# Patient Record
Sex: Female | Born: 1945 | Race: White | Hispanic: No | Marital: Married | Smoking: Former smoker
Health system: Southern US, Community
[De-identification: ages and names within clinical notes are randomized; demographics above are authoritative.]

## PROBLEM LIST (undated history)

## (undated) DIAGNOSIS — I609 Nontraumatic subarachnoid hemorrhage, unspecified: Secondary | ICD-10-CM

## (undated) DIAGNOSIS — S0990XA Unspecified injury of head, initial encounter: Secondary | ICD-10-CM

## (undated) HISTORY — DX: Unspecified injury of head, initial encounter: S09.90XA

## (undated) HISTORY — PX: OTHER SURGICAL HISTORY: SHX169

## (undated) HISTORY — PX: PELVIC LAPAROSCOPY: SHX162

## (undated) HISTORY — PX: NASAL SINUS SURGERY: SHX719

## (undated) HISTORY — DX: Nontraumatic subarachnoid hemorrhage, unspecified: I60.9

## (undated) HISTORY — PX: TUBAL LIGATION: SHX77

---

## 1980-08-15 HISTORY — PX: AUGMENTATION MAMMAPLASTY: SUR837

## 1998-10-15 ENCOUNTER — Encounter: Payer: Self-pay | Admitting: Pulmonary Disease

## 1999-05-24 ENCOUNTER — Encounter: Admission: RE | Admit: 1999-05-24 | Discharge: 1999-05-24 | Payer: Self-pay | Admitting: Obstetrics and Gynecology

## 1999-08-25 ENCOUNTER — Encounter: Payer: Self-pay | Admitting: Otolaryngology

## 1999-08-25 ENCOUNTER — Encounter: Admission: RE | Admit: 1999-08-25 | Discharge: 1999-08-25 | Payer: Self-pay | Admitting: Otolaryngology

## 1999-09-08 ENCOUNTER — Other Ambulatory Visit: Admission: RE | Admit: 1999-09-08 | Discharge: 1999-09-08 | Payer: Self-pay | Admitting: Obstetrics and Gynecology

## 1999-09-21 ENCOUNTER — Encounter (INDEPENDENT_AMBULATORY_CARE_PROVIDER_SITE_OTHER): Payer: Self-pay

## 1999-09-21 ENCOUNTER — Other Ambulatory Visit: Admission: RE | Admit: 1999-09-21 | Discharge: 1999-09-21 | Payer: Self-pay | Admitting: Otolaryngology

## 2002-09-25 ENCOUNTER — Encounter: Admission: RE | Admit: 2002-09-25 | Discharge: 2002-09-25 | Payer: Self-pay | Admitting: Obstetrics and Gynecology

## 2002-09-25 ENCOUNTER — Encounter: Payer: Self-pay | Admitting: Obstetrics and Gynecology

## 2002-10-08 ENCOUNTER — Other Ambulatory Visit: Admission: RE | Admit: 2002-10-08 | Discharge: 2002-10-08 | Payer: Self-pay | Admitting: Obstetrics and Gynecology

## 2004-02-04 ENCOUNTER — Encounter: Admission: RE | Admit: 2004-02-04 | Discharge: 2004-02-04 | Payer: Self-pay | Admitting: Obstetrics and Gynecology

## 2004-03-17 ENCOUNTER — Other Ambulatory Visit: Admission: RE | Admit: 2004-03-17 | Discharge: 2004-03-17 | Payer: Self-pay | Admitting: Obstetrics and Gynecology

## 2004-12-02 ENCOUNTER — Encounter: Admission: RE | Admit: 2004-12-02 | Discharge: 2004-12-02 | Payer: Self-pay | Admitting: Neurological Surgery

## 2004-12-28 ENCOUNTER — Ambulatory Visit: Payer: Self-pay | Admitting: Pulmonary Disease

## 2005-01-25 ENCOUNTER — Ambulatory Visit: Payer: Self-pay | Admitting: Pulmonary Disease

## 2005-04-07 ENCOUNTER — Encounter: Admission: RE | Admit: 2005-04-07 | Discharge: 2005-04-07 | Payer: Self-pay | Admitting: Obstetrics and Gynecology

## 2005-04-26 ENCOUNTER — Other Ambulatory Visit: Admission: RE | Admit: 2005-04-26 | Discharge: 2005-04-26 | Payer: Self-pay | Admitting: Obstetrics and Gynecology

## 2006-06-13 ENCOUNTER — Encounter: Admission: RE | Admit: 2006-06-13 | Discharge: 2006-06-13 | Payer: Self-pay | Admitting: Obstetrics and Gynecology

## 2006-06-21 ENCOUNTER — Other Ambulatory Visit: Admission: RE | Admit: 2006-06-21 | Discharge: 2006-06-21 | Payer: Self-pay | Admitting: Obstetrics and Gynecology

## 2006-09-22 ENCOUNTER — Encounter: Admission: RE | Admit: 2006-09-22 | Discharge: 2006-09-22 | Payer: Self-pay | Admitting: Radiology

## 2006-09-22 ENCOUNTER — Inpatient Hospital Stay (HOSPITAL_COMMUNITY): Admission: EM | Admit: 2006-09-22 | Discharge: 2006-09-26 | Payer: Self-pay | Admitting: Emergency Medicine

## 2006-10-10 ENCOUNTER — Encounter: Admission: RE | Admit: 2006-10-10 | Discharge: 2006-10-10 | Payer: Self-pay | Admitting: Radiology

## 2006-12-29 ENCOUNTER — Encounter: Admission: RE | Admit: 2006-12-29 | Discharge: 2006-12-29 | Payer: Self-pay | Admitting: Neurosurgery

## 2007-06-20 ENCOUNTER — Encounter: Admission: RE | Admit: 2007-06-20 | Discharge: 2007-06-20 | Payer: Self-pay | Admitting: Obstetrics and Gynecology

## 2007-07-19 ENCOUNTER — Other Ambulatory Visit: Admission: RE | Admit: 2007-07-19 | Discharge: 2007-07-19 | Payer: Self-pay | Admitting: Obstetrics and Gynecology

## 2010-03-30 ENCOUNTER — Encounter: Admission: RE | Admit: 2010-03-30 | Discharge: 2010-03-30 | Payer: Self-pay | Admitting: Obstetrics and Gynecology

## 2010-04-13 ENCOUNTER — Other Ambulatory Visit: Admission: RE | Admit: 2010-04-13 | Discharge: 2010-04-13 | Payer: Self-pay | Admitting: Obstetrics and Gynecology

## 2010-04-13 ENCOUNTER — Ambulatory Visit: Payer: Self-pay | Admitting: Obstetrics and Gynecology

## 2010-07-12 ENCOUNTER — Telehealth (INDEPENDENT_AMBULATORY_CARE_PROVIDER_SITE_OTHER): Payer: Self-pay | Admitting: *Deleted

## 2010-07-15 ENCOUNTER — Ambulatory Visit: Payer: Self-pay | Admitting: Pulmonary Disease

## 2010-07-15 DIAGNOSIS — J45901 Unspecified asthma with (acute) exacerbation: Secondary | ICD-10-CM | POA: Insufficient documentation

## 2010-07-15 DIAGNOSIS — J209 Acute bronchitis, unspecified: Secondary | ICD-10-CM | POA: Insufficient documentation

## 2010-07-15 DIAGNOSIS — J4520 Mild intermittent asthma, uncomplicated: Secondary | ICD-10-CM | POA: Insufficient documentation

## 2010-08-24 ENCOUNTER — Ambulatory Visit
Admission: RE | Admit: 2010-08-24 | Discharge: 2010-08-24 | Payer: Self-pay | Source: Home / Self Care | Attending: Pulmonary Disease | Admitting: Pulmonary Disease

## 2010-09-13 ENCOUNTER — Telehealth: Payer: Self-pay | Admitting: Pulmonary Disease

## 2010-09-14 ENCOUNTER — Ambulatory Visit
Admission: RE | Admit: 2010-09-14 | Discharge: 2010-09-14 | Payer: Self-pay | Source: Home / Self Care | Attending: Pulmonary Disease | Admitting: Pulmonary Disease

## 2010-09-14 DIAGNOSIS — J019 Acute sinusitis, unspecified: Secondary | ICD-10-CM | POA: Insufficient documentation

## 2010-09-14 NOTE — Assessment & Plan Note (Signed)
Summary: acute sick visit for bronchitis and asthma exacerbation   Copy to:  Self  Primary Provider/Referring Provider:  n/a  CC:  former pt was last seen 2006. pt c/o dry cough w/ occas phlem, increased sob, wheezing, and chest congestion and tightness x3 months ago. Danielle Riddle  History of Present Illness: The pt comes in today for evaluation of asthma.  She was diagnosed with airflow obstruction in 2006 that was felt to be due to asthma, and she did well on treatment with asmanex.  She has since been lost to f/u, and has not maintained on her meds.  She feels she did very well until 3 mos ago when she developed an episode of chest tightness with worsening sob.  Has had 2 more severe episodes since then, and now has chest congestion with cough productive of yellow mucus.  She has also had nasal congestion, and postnasal drip.  She denies any fevers or chills.  Preventive Screening-Counseling & Management  Alcohol-Tobacco     Smoking Status: quit  Current Medications (verified): 1)  None  Allergies (verified): No Known Drug Allergies  Past History:  Past Medical History: Asthma h/o nasal polyps  Past Surgical History: tubal ligation sinus surgery  Family History: Reviewed history and no changes required. asthma--mother allergies: mother  Social History: Reviewed history and no changes required. married Patient states former smoker. just smoked in high school. 1-2 cigs a week. occupation: n/a children--2 Smoking Status:  quit  Review of Systems       The patient complains of shortness of breath with activity, shortness of breath at rest, and non-productive cough.  The patient denies productive cough, coughing up blood, chest pain, irregular heartbeats, acid heartburn, indigestion, loss of appetite, weight change, abdominal pain, difficulty swallowing, sore throat, tooth/dental problems, headaches, nasal congestion/difficulty breathing through nose, sneezing, itching, ear ache,  anxiety, depression, hand/feet swelling, joint stiffness or pain, rash, change in color of mucus, and fever.    Vital Signs:  Patient profile:   65 year old female Height:      65 inches Weight:      144.25 pounds BMI:     24.09 O2 Sat:      95 % on Room air Temp:     97.8 degrees F oral Pulse rate:   83 / minute BP sitting:   118 / 62  (left arm) Cuff size:   regular  Vitals Entered By: Carver Fila (July 15, 2010 1:48 PM)  O2 Flow:  Room air CC: former pt was last seen 2006. pt c/o dry cough w/ occas phlem, increased sob, wheezing, chest congestion and tightness x3 months ago.  Comments meds and allergies updated Phone number updated Carver Fila  July 15, 2010 1:49 PM    Physical Exam  General:  wd female in nad Eyes:  PERRLA and EOMI.   Nose:  significant edema and inflammation, no purulence noted. Mouth:  clear, no exudates seen. Neck:  no jvd, tmg, LN Lungs:  diffuse wheezing, rhonchi thoughout no crackles. Heart:  rrr, no mrg Abdomen:  soft and nontender, bs+ Extremities:  no edema or cyanosis, pulses intact distally Neurologic:  alert and oriented, moves all 4.   Impression & Recommendations:  Problem # 1:  INTRINSIC ASTHMA, WITH EXACERBATION (ICD-493.12) the pt has a h/o asthma which has been well controlled in the past with asmanex.  I have not seen her since 2006, and she stopped taking her meds.  She has done well until  she developed what I suspect is sinobronchitis, and now has acute bronchospasm on exam.  She will need treatment with abx and a prednisone taper.  Will also restart on maintenance medications.  I have discussed with her the concept of airway remodeling, and the importance of staying on maintenance meds for airway inflammation.  Problem # 2:  BRONCHITIS, ACUTE (ICD-466.0) the pt's h/o is most suggestive of sinobronchitis.  Will treat with an abx.  Medications Added to Medication List This Visit: 1)  Prednisone 10 Mg Tabs (Prednisone)  .... Take 4 each day for 2 days, then 3 each day for 2 days, then 2 each day for 2 days, then 1 each day for 2 days, then stop 2)  Cefdinir 300 Mg Caps (Cefdinir) .... 2 each day for 7days 3)  Asmanex 60 Metered Doses 220 Mcg/inh Aepb (Mometasone furoate) .... 2 at bedtime each night 4)  Ventolin Hfa 108 (90 Base) Mcg/act Aers (Albuterol sulfate) .... 2 puffs every 4-6 hours as needed  Other Orders: Est. Patient Level IV (42595)  Patient Instructions: 1)  will treat with prednisone taper...start tonight 2)  take omnicef 300mg  2 each day for 7 days 3)  restart asmanex 2 inhalation at bedtime for next 4 weeks, then can decrease to one at bedtime.  rinse mouth well. 4)  ventolin rescue inhaler...2 puffs up to every 4-6 hrs if needed for emergencies.  Can use more regularly until you start to get better, then use only as needed. 5)  followup with me in 6wks, but call if not doing well.   Prescriptions: VENTOLIN HFA 108 (90 BASE) MCG/ACT  AERS (ALBUTEROL SULFATE) 2 puffs every 4-6 hours as needed  #1 x 6   Entered and Authorized by:   Barbaraann Share MD   Signed by:   Barbaraann Share MD on 07/15/2010   Method used:   Print then Give to Patient   RxID:   [id number] ASMANEX 60 METERED DOSES 220 MCG/INH AEPB (MOMETASONE FUROATE) 2 at bedtime each night  #1 x 11   Entered and Authorized by:   Barbaraann Share MD   Signed by:   Barbaraann Share MD on 07/15/2010   Method used:   Print then Give to Patient   RxID:   [id number] CEFDINIR 300 MG CAPS (CEFDINIR) 2 each day for 7days  #14 x 0   Entered and Authorized by:   Barbaraann Share MD   Signed by:   Barbaraann Share MD on 07/15/2010   Method used:   Print then Give to Patient   RxID:   [id number] PREDNISONE 10 MG  TABS (PREDNISONE) take 4 each day for 2 days, then 3 each day for 2 days, then 2 each day for 2 days, then 1 each day for 2 days, then stop  #20 x 0   Entered and Authorized by:   Barbaraann Share MD   Signed by:   Barbaraann Share MD on 07/15/2010   Method used:   Print then Give to Patient   RxID:   [id number]

## 2010-09-14 NOTE — Progress Notes (Signed)
Summary: [facility]   Imported By: Sherian Rein 07/16/2010 11:30:36  _____________________________________________________________________  External Attachment:    Type:   Image     Comment:   External Document

## 2010-09-14 NOTE — Progress Notes (Signed)
Summary: wants appt this week/ pt scheduled 11/30  Phone Note Call from Patient Call back at Home Phone [phone]   Caller: Patient Call For: clance Summary of Call: pt wants to see you this week last seen in 2006  Initial call taken by: Lacinda Axon,  July 12, 2010 10:05 AM  Follow-up for Phone Call        Called, spoke with pt.  She states she is having increased SOB, "weight on chest," and "squeeking" when breathing x 1-2 wks.  States she has no PCP, requesting to see KC this week as she really does not want to go to urgent care.  She is aware she is considered a new pt bc if has been over 3 yrs since she was seen - 1st available consult slot dec 12.  pls advise if able to work pt in this week or not.  Thanks! Follow-up by: Gweneth Dimitri RN,  July 12, 2010 12:26 PM  Additional Follow-up for Phone Call Additional follow up Details #1::        mindy, lets look at my schedule tomorrow to see if there is a place to put her. Additional Follow-up by: Barbaraann Share MD,  July 12, 2010 5:59 PM    Additional Follow-up for Phone Call Additional follow up Details #2::    called and spoke with pt and we had an openeing on 11/30 at 2:00 and pt will be coming in. pt is aware to arrive 15 minutes early due to we have not seen her since 2006 and has to fill out the consult form. pt verbalized understanding Carver Fila  July 13, 2010 8:44 AM    Appended Document: wants appt this week/ pt scheduled 11/30 called to inform pt that the apt is not on 11/30 instead it's 12/1 at 2: 00. lmom to inform pt of that.

## 2010-09-16 NOTE — Assessment & Plan Note (Signed)
Summary: rov for asthma   Copy to:  Self  Primary Provider/Referring Provider:  n/a  CC:  6 week f/u appt.  breathing is "much better."   finishe abx and pred taper.  hasn't needed to use rescue hfa. .  History of Present Illness: the pt comes in today for f/u of her known asthma.  At the last visit, she was having an acute exacerbation, and was treated with a course of prednisone and started back on maintenance therapy.  She comes in today where she is much improved, and has not required her rescue inhaler since the last visit.  She is maintaining on her asmanex.  She denies any cough or congestion currently  Current Medications (verified): 1)  Asmanex 60 Metered Doses 220 Mcg/inh Aepb (Mometasone Furoate) .... 2 At Bedtime Each Night 2)  Ventolin Hfa 108 (90 Base) Mcg/act  Aers (Albuterol Sulfate) .... 2 Puffs Every 4-6 Hours As Needed  Allergies (verified): No Known Drug Allergies  Review of Systems       The patient complains of shortness of breath with activity and nasal congestion/difficulty breathing through nose.  The patient denies shortness of breath at rest, productive cough, non-productive cough, coughing up blood, chest pain, irregular heartbeats, acid heartburn, indigestion, loss of appetite, weight change, abdominal pain, difficulty swallowing, sore throat, tooth/dental problems, headaches, sneezing, itching, ear ache, anxiety, depression, hand/feet swelling, joint stiffness or pain, rash, change in color of mucus, and fever.    Vital Signs:  Patient profile:   65 year old female Height:      65 inches Weight:      150.50 pounds BMI:     25.14 O2 Sat:      96 % on Room air Temp:     97.7 degrees F oral Pulse rate:   66 / minute BP sitting:   104 / 70  (left arm) Cuff size:   regular  Vitals Entered By: Arman Filter LPN (August 24, 2010 11:57 AM)  O2 Flow:  Room air CC: 6 week f/u appt.  breathing is "much better."   finishe abx and pred taper.  hasn't needed to  use rescue hfa.  Comments Medications reviewed with patient Arman Filter LPN  August 24, 2010 11:58 AM    Physical Exam  General:  wd female in nad Lungs:  totally clear to auscultation Heart:  rrr, no mrg   Impression & Recommendations:  Problem # 1:  ASTHMA (ICD-493.90) the pt is much improved after treatment with a course of steroids and getting back on maintenance ICS.  I have asked her to maintain on asmanex at bedtime, but can decrease to one inhalation at bedtime in next few weeks.  She will f/u in one year.  Other Orders: No Charge Patient Arrived (NCPA0) (NCPA0)  Patient Instructions: 1)  continue on asmanex 2 inhalations at bedtime for another few weeks, then decrease to one . 2)  followup with me in 1 year, or sooner if having issues.   Immunization History:  Influenza Immunization History:    Influenza:  historical (09/15/2009)

## 2010-09-19 ENCOUNTER — Encounter (HOSPITAL_COMMUNITY): Payer: Self-pay

## 2010-09-19 ENCOUNTER — Emergency Department (HOSPITAL_COMMUNITY): Payer: PRIVATE HEALTH INSURANCE

## 2010-09-19 ENCOUNTER — Emergency Department (HOSPITAL_COMMUNITY)
Admission: EM | Admit: 2010-09-19 | Discharge: 2010-09-19 | Disposition: A | Discharge status: Home / Self Care | Payer: PRIVATE HEALTH INSURANCE | Attending: Emergency Medicine | Admitting: Emergency Medicine

## 2010-09-19 DIAGNOSIS — G43909 Migraine, unspecified, not intractable, without status migrainosus: Secondary | ICD-10-CM | POA: Insufficient documentation

## 2010-09-19 DIAGNOSIS — M542 Cervicalgia: Secondary | ICD-10-CM | POA: Insufficient documentation

## 2010-09-19 DIAGNOSIS — H53149 Visual discomfort, unspecified: Secondary | ICD-10-CM | POA: Insufficient documentation

## 2010-09-19 DIAGNOSIS — H5710 Ocular pain, unspecified eye: Secondary | ICD-10-CM | POA: Insufficient documentation

## 2010-09-19 DIAGNOSIS — R112 Nausea with vomiting, unspecified: Secondary | ICD-10-CM | POA: Insufficient documentation

## 2010-09-19 DIAGNOSIS — J329 Chronic sinusitis, unspecified: Secondary | ICD-10-CM | POA: Insufficient documentation

## 2010-09-19 DIAGNOSIS — J45909 Unspecified asthma, uncomplicated: Secondary | ICD-10-CM | POA: Insufficient documentation

## 2010-09-19 LAB — CBC
HCT: 40.9 % (ref 36.0–46.0)
Hemoglobin: 13.7 g/dL (ref 12.0–15.0)
MCH: 31.4 pg (ref 26.0–34.0)
MCV: 93.6 fL (ref 78.0–100.0)
RBC: 4.37 MIL/uL (ref 3.87–5.11)

## 2010-09-19 LAB — POCT I-STAT, CHEM 8
BUN: 10 mg/dL (ref 6–23)
Calcium, Ion: 1.1 mmol/L — ABNORMAL LOW (ref 1.12–1.32)
Potassium: 4.1 mEq/L (ref 3.5–5.1)
TCO2: 25 mmol/L (ref 0–100)

## 2010-09-19 LAB — DIFFERENTIAL
Basophils Absolute: 0 10*3/uL (ref 0.0–0.1)
Basophils Relative: 0 % (ref 0–1)
Eosinophils Absolute: 0.2 10*3/uL (ref 0.0–0.7)
Lymphs Abs: 0.8 10*3/uL (ref 0.7–4.0)
Monocytes Absolute: 0.3 10*3/uL (ref 0.1–1.0)
Monocytes Relative: 5 % (ref 3–12)
Neutro Abs: 5.7 10*3/uL (ref 1.7–7.7)

## 2010-09-19 LAB — PROTIME-INR: Prothrombin Time: 12.9 seconds (ref 11.6–15.2)

## 2010-09-22 NOTE — Progress Notes (Signed)
Summary: rx req / cough> appt set  Phone Note Call from Patient Call back at Home Phone [phone]   Caller: Patient Call For: clance Summary of Call: pt saw kc 8 wks ago re: cough. cough went away but returned 2 wks ago. pt's father passed away at the time this cough returned 2 wks ago and she now asks if a rx can [facility] [place]. pt # [phone] Initial call taken by: Tivis Ringer, CNA,  September 13, 2010 9:06 AM  Follow-up for Phone Call        Spoke with pt and she states sfter seeing [facility] in december her cough improved, but over the last 2 weeks it has returned. pt is c/o productive cough with yellow phlegm as well as head congestio. Pt set to see [facility] tomorrow at 10:30am. Carron Curie CMA  September 13, 2010 9:49 AM

## 2010-09-30 NOTE — Assessment & Plan Note (Signed)
Summary: acute sick visit for sinusitis   Copy to:  Self  Primary Provider/Referring Provider:  n/a  CC:  Pt states cough had improved but after last OV productive cough returned with yellow phlegm. Pt never went to asmanex once daily. Marland Kitchen  History of Present Illness: the pt comes in today for an acute sick visit.  She has known asthma, and recent was treated for an episode of acute bronchitis.  She did improve, but her sinus symptoms never went away, and now she is having persistent cough with nasal congestion and purulence from nares.  Her breathing is better since getting back on the asmanex.  Current Medications (verified): 1)  Asmanex 60 Metered Doses 220 Mcg/inh Aepb (Mometasone Furoate) .... 2 At Bedtime Each Night 2)  Ventolin Hfa 108 (90 Base) Mcg/act  Aers (Albuterol Sulfate) .... 2 Puffs Every 4-6 Hours As Needed  Allergies (verified): No Known Drug Allergies  Past History:  Past medical, surgical, family and social histories (including risk factors) reviewed, and no changes noted (except as noted below).  Past Medical History: Reviewed history from 07/15/2010 and no changes required. Asthma h/o nasal polyps  Past Surgical History: Reviewed history from 07/15/2010 and no changes required. tubal ligation sinus surgery  Family History: Reviewed history from 07/15/2010 and no changes required. asthma--mother allergies: mother  Social History: Reviewed history from 07/15/2010 and no changes required. married Patient states former smoker. just smoked in high school. 1-2 cigs a week. occupation: n/a children--2  Review of Systems       The patient complains of productive cough and change in color of mucus.  The patient denies shortness of breath with activity, shortness of breath at rest, non-productive cough, coughing up blood, chest pain, irregular heartbeats, acid heartburn, indigestion, loss of appetite, weight change, abdominal pain, difficulty swallowing, sore  throat, tooth/dental problems, headaches, nasal congestion/difficulty breathing through nose, sneezing, itching, ear ache, anxiety, depression, hand/feet swelling, joint stiffness or pain, rash, and fever.    Vital Signs:  Patient profile:   65 year old female Height:      65 inches Weight:      147 pounds O2 Sat:      96 % on Room air Temp:     98.0 degrees F oral Pulse rate:   77 / minute BP sitting:   122 / 72  (right arm) Cuff size:   regular  Vitals Entered By: Carron Curie CMA (September 14, 2010 10:52 AM)  O2 Flow:  Room air CC: Pt states cough had improved but after last OV productive cough returned with yellow phlegm. Pt never went to asmanex once daily.  Comments Medications reviewed with patient Carron Curie CMA  September 14, 2010 10:53 AM Daytime phone number verified with patient.    Physical Exam  General:  thin female in nad Nose:  erythematous and swollen mucosa, no purulence seen Mouth:  no exudates or lesions seen  Lungs:  a rare expir wheeze, good airflow Heart:  rrr Extremities:  no edema or cyanosis  Neurologic:  alert and oriented, moves all 4.   Impression & Recommendations:  Problem # 1:  SINUSITIS, ACUTE (ICD-461.9)  she was recently treated for acute bronchitis with a short course of abx, but currently her history is more c/w acute sinusitis.  She will need a longer course of antibiotics, and to work on a nasal hygiene regimen with rinses.  Problem # 2:  ASTHMA (ICD-493.90) she has a few minor wheezes, but nothing of significance.  She also feels that her breathing is reasonable at this point.  She is to stay on asmanex, and will give her a prescription for prednisone if things worsen.  Medications Added to Medication List This Visit: 1)  Augmentin 875-125 Mg Tabs (Amoxicillin-pot clavulanate) .... By mouth twice daily on full stomach with large glass of water. 2)  Prednisone 10 Mg Tabs (Prednisone) .... Take 4 each day for 2 days, then 3  each day for 2 days, then 2 each day for 2 days, then 1 each day for 2 days, then stop  Other Orders: No Charge Patient Arrived (NCPA0) (NCPA0)  Patient Instructions: 1)  will treat with augmentin for 2 weeks for probable sinusitis. 2)  will give you a prescription for prednisone to hold, and take if not better in a week or if breathing symptoms worsen 3)  don't forget about your rescue inhaler if needed. 4)  neilmed sinus rinses am and pm for a week. 5)  let me know if you are not getting better.  Prescriptions: PREDNISONE 10 MG  TABS (PREDNISONE) take 4 each day for 2 days, then 3 each day for 2 days, then 2 each day for 2 days, then 1 each day for 2 days, then stop  #20 x 0   Entered and Authorized by:   Barbaraann Share MD   Signed by:   Barbaraann Share MD on 09/14/2010   Method used:   Print then Give to Patient   RxID:   [id number] AUGMENTIN 875-125 MG  TABS (AMOXICILLIN-POT CLAVULANATE) By mouth twice daily on full stomach with large glass of water.  #28 x 0   Entered and Authorized by:   Barbaraann Share MD   Signed by:   Barbaraann Share MD on 09/14/2010   Method used:   Print then Give to Patient   RxID:   [id number]

## 2010-12-31 NOTE — Consult Note (Signed)
Danielle Riddle, Danielle Riddle              ACCOUNT NO.:  [id number]   MEDICAL RECORD NO.:  [id number]          PATIENT TYPE:  EMS   LOCATION:  [facility]                         FACILITY:  [facility]   PHYSICIAN:  Hewitt Shorts, M.D.DATE OF BIRTH:  10/20/1945   DATE OF CONSULTATION:  09/22/2006  DATE OF DISCHARGE:                                 CONSULTATION   HISTORY OF THE PRESENT ILLNESS:  The patient is a 65 year old right-  handed white female who is the wife of Dr. Kara Pacer, a radiologist in  our community.  She was thrown from a horse earlier this morning at  about 11:30, when the horse hit an electric wire.  It reared back and  she was thrown from it.  She did suffer a brief loss of consciousness  lasting less than one hour.  She was with two friends and they took her  to [facility] where a CT scan of the brain was performed without  contrast.  That showed left frontal hemorrhagic contusions and minimal  traumatic subarachnoid hemorrhage, and her husband contacted me and  brought her to the [facility] emergency room for  evaluation.   At this time the patient is drowsy, but easily aroused.  She complains  of a bitemporal headache as well as mild nausea.  She denies diplopia or  blurred vision.  She does complain of pain associated with swelling in  both of her legs, right worse than left.  She also notes a bruise of the  left forearm, but denies any associated pain.   PAST MEDICAL HISTORY:  She does not describe any history of  hypertension, myocardial infarction, cancer, stroke, diabetes, peptic  ulcer disease or lung disease.  Previous surgery includes a cesarean  section; as well as a sinus surgery and polyp removal by Dr. Suzanna Obey.  She takes no medications on a regular basis, and denies any allergies.   FAMILY HISTORY:  Her father is 47 years old and in good health.  Mother  died at about age 25 of an intracerebral hematoma.   SOCIAL HISTORY:  The  patient is married.  She does not smoke.  She does  drink alcoholic beverages nightly.   REVIEW OF SYSTEMS:  Review of systems is notable for those systems as  described in the History of the Present Illness and past medical  history, but is otherwise unremarkable.   PHYSICAL EXAMINATION:  GENERAL APPEARANCE:  The patient is a well-  developed, well-nourished white female, a bit drowsy, but easily  aroused, and in no acute distress.  VITAL SIGNS:  Temperature is 96.7, pulse 63, blood pressure 125/78,  respiratory rate 18, oxygen saturation 100% on room air.  EXTERNAL EXAMINATION:  External examination shows no Battle's sign or  raccoon's sign.  There is no hemotympanum.  There is no chest wall  tenderness.  There is no pelvic tenderness to pelvic rock.  There is  bruising and swelling in the left forearm as well as in the legs  bilaterally, right worse than left, and there is discomfort on  examination of the swelling  of the legs.  LUNGS:  Lungs are clear to auscultation.  She has symmetrical  respiratory excursion.  HEART:  Regular rate and rhythm.  S1, S2.  There is no murmur.  ABDOMEN:  The abdomen is soft, nondistended, nontender in all four  quadrants, and bowel sounds are present throughout.  NEUROLOGIC:  Neurologic examination shows on mental status that the  patient is drowsy but easily aroused.  She is oriented to her name,  [facility] and February 2008; but her responses are  somewhat slow.  She follows commands.  Her speech is fluent.  She has  good comprehension.  Cranial nerves show pupils to be equal, round and  reactive to light and about 3 mm bilaterally.  Extraocular movements are  intact.  Facial movement is symmetrical.  Hearing is present  bilaterally.  Palatal movement is symmetrical.  Tongue is midline.  Motor examination shows 5/5 strength in the upper extremities and  symmetrical movement in the lower extremities, although testing in the   lower extremities is limited due to pain and swelling in her legs  bilaterally.  Gait and stance are not tested due to her injuries.   IMPRESSION:  1. Closed head injury with a Glasgow Coma Scale of 14-15/15.  She has      left frontal hemorrhagic cerebral contusions with minimal traumatic      subarachnoid hemorrhage.  2. Multiple trauma with extremity injuries involving the left upper      extremity and both lower extremities.   RECOMMENDATIONS:  I spoke with the patient and her husband about her  condition.  I have also discussed the case with Dr. Violeta Gelinas who  is the trauma surgeon on duty today; and have requested that he see the  patient in consultation, and the trauma surgery service has agreed to  admit the patient for multiple trauma and we will follow as consultants.   I have recommended a follow-up CT scan of the brain without contrast in  the morning, or sooner if she has increased difficulties.  I have  recommended that she be kept NPO and that neuro checks be done on a  regular basis.      Hewitt Shorts, M.D.  Electronically Signed     RWN/MEDQ  D:  09/22/2006  T:  09/23/2006  Job:  [id number]

## 2010-12-31 NOTE — H&P (Signed)
Danielle Riddle, Danielle Riddle              ACCOUNT NO.:  [id number]   MEDICAL RECORD NO.:  [id number]          PATIENT TYPE:  INP   LOCATION:  1843                         FACILITY:  [facility]   PHYSICIAN:  Gabrielle Dare. Janee Morn, M.D.DATE OF BIRTH:  [birth year]   DATE OF ADMISSION:  09/22/2006  DATE OF DISCHARGE:                              HISTORY & PHYSICAL   CHIEF COMPLAINT:  Head injury after being thrown from a horse.   HISTORY OF PRESENT ILLNESS:  The patient is a 65 year old white female  who was riding a horse, when it reared back after striking an  electrified wire.  She was thrown off.  She had loss of consciousness at  the scene.  Her husband is Dr. Liliane Shi, a radiologist, and he meet her at  the outpatient radiology facility where she underwent a CT scan of the  head.  This demonstrated left frontal intracerebral contusion and some  subarachnoid hemorrhage; and Dr. Liliane Shi spoke with Dr. Newell Coral from  neurosurgery and we are asked to see her as well to admit.  The patient  did have a loss of consciousness.  She had a brief amnestic episode  surrounding the event.  She is also complaining of some calf pain  bilaterally.   PAST MEDICAL HISTORY:  Negative.  Her primary doctor is Dr. Awanda Mink.   PAST SURGICAL HISTORY:  Cesarean section.   SOCIAL HISTORY:  She does not smoke, or drink alcohol.  She does not use  drugs.   ALLERGIES:  No known drug allergies.   MEDICATIONS:  None.   REVIEW OF SYSTEMS:  See pertinent neurologic and musculoskeletal  findings as above, but is otherwise negative.   PHYSICAL EXAMINATION:  VITAL SIGNS:  Temperature 96.7, pulse 63,  respirations 18, blood pressure 125/78, saturations are 100%.  HEENT:  Her head is normocephalic, without obvious hematoma.  Pupils are  equal and reactive.  Ears are clear.  The face is symmetric and  atraumatic.  NECK:  The neck had some mild lateral tenderness on the right side, with  no midline tenderness or stepoff.  LUNGS:  Lungs are clear to auscultation, with no wheezing.  CARDIOVASCULAR:  The heart is regular.  No murmurs are heard, and pulse  is palpable in the left chest.  Distal pulses are 2+.  ABDOMEN:  The abdomen is soft and nontender.  Bowel sounds are normal.  No organomegaly is noted.  PELVIC:  The pelvis is stable anteriorly.  MUSCULOSKELETAL:  She has a contusion with a hematoma on the left  forearm.  She also has some pain and edema in the bilateral calves,  especially distally, medial and proximal to her Achilles tendon.  The  pain is exacerbated with plantar flexion.  BACK:  The back has no stepoffs or tenderness along the midline.  NEUROLOGIC:  She is somewhat slow to answer questions, but Glasgow Coma  Scale is 15, and she is oriented x 3.   LABORATORY INVESTIGATIONS:  Laboratory studies are pending.  A fast  ultrasound was done in the emergency department.  This demonstrates no  free intraabdominal fluid, with good views.  Chest x-ray, pelvis x-ray,  and extremity x-rays are pending.  CT scan of the head shows a cerebral  contusion along the left sphenoid wing.  There is an upper left frontal  contusion as well, and a left posterior subarachnoid hemorrhage.   IMPRESSION:  This is a 65 year old female who was thrown from a horse  with:   1. 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  2. Contusions, left forearm.  3. Bilateral calf muscular contusions and possible tendon injuries.  4. Cervical strain.   PLAN:  The plan will be admit the patient to a neurosurgical ICU for  observation.  We will get a follow-up CT scan in the morning.  Right now  we will check a chest x-ray, pelvis x-ray, cervical spine films, left  upper extremity and bilateral tib-fib x-rays, as well as a CBC and a BM  ET.  The plan was discussed in detail with the patient and her husband.  I also spoke with Dr. Newell Coral in person.      Gabrielle Dare Janee Morn, M.D.   Electronically Signed     BET/MEDQ  D:  09/22/2006  T:  09/23/2006  Job:  [id number]   cc:   Hewitt Shorts, M.D.

## 2010-12-31 NOTE — Discharge Summary (Signed)
NAMESERENITI, WAN              ACCOUNT NO.:  [id number]   MEDICAL RECORD NO.:  [id number]          PATIENT TYPE:  INP   LOCATION:  5114                         FACILITY:  [facility]   PHYSICIAN:  Gabrielle Dare. Janee Morn, M.D.DATE OF BIRTH:  Mar 04, 1946   DATE OF ADMISSION:  09/22/2006  DATE OF DISCHARGE:  09/26/2006                               DISCHARGE SUMMARY   DISCHARGE DIAGNOSES:  1. Fall from horse.  2. 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  3. Bilateral calf strains.   CONSULTANTS:  1. Jene Every, M.D., orthopedic surgery.  2. Hewitt Shorts, M.D., neurosurgery.   PROCEDURE:  None.   HISTORY OF PRESENT ILLNESS:  This is a 65 year old white female who was  thrown from a horse.  There was a positive loss of consciousness at the  scene.  She was taken directly to an off-site radiology center because  her husband is a radiologist.  There, she had a CT of the head which  showed the intracerebral contusions, and she was brought to [facility]  for evaluation.   WORKUP:  The patient's workup did indeed demonstrate multiple  intracerebral contusions noted.  She had exquisite calf tenderness, left  more so than right.  There was some worry of a plantaris rupture, and  orthopedic surgery was consulted.  She was admitted for observation and  repeat CT scan.   HOSPITAL COURSE:  The patient's hospital course was typical.  She had  some dizziness upon rising and standing, although this gradually  lessened until she was able to ambulate mostly on her own.  Her calf  tenderness also improved with time.  She was able to tolerate a regular  diet and void without difficulty.  Her mentation stayed intact  throughout.  She was able to be transferred home in good condition.   DISCHARGE MEDICATIONS:  Norco 5/325, take one to two p.o. q.4h. p.r.n.  pain, #60 with no refill.   FOLLOW UP:  The patient follow up with Dr. Newell Coral in his office and  will call for an  appointment.  Followup with Dr. Shelle Iron will be on an as-  needed basis if the calves do not completely heal.  An MRI may be  necessary.  I did suggest that if the dizziness did not resolve within  two weeks that she consult an otolaryngologist.  If she has questions or  concerns, she may call trauma, but followup with [place] will be on an as-  needed basis.      Earney Hamburg, P.A.      Gabrielle Dare Janee Morn, M.D.  Electronically Signed    MJ/MEDQ  D:  09/26/2006  T:  09/26/2006  Job:  [id number]   cc:   Hewitt Shorts, M.D.  Jene Every, M.D.

## 2011-05-09 ENCOUNTER — Other Ambulatory Visit: Payer: Self-pay | Admitting: Obstetrics and Gynecology

## 2011-05-09 DIAGNOSIS — Z1231 Encounter for screening mammogram for malignant neoplasm of breast: Secondary | ICD-10-CM

## 2011-05-20 ENCOUNTER — Ambulatory Visit
Admission: RE | Admit: 2011-05-20 | Discharge: 2011-05-20 | Disposition: A | Discharge status: Home / Self Care | Payer: PRIVATE HEALTH INSURANCE | Source: Ambulatory Visit | Attending: Obstetrics and Gynecology | Admitting: Obstetrics and Gynecology

## 2011-05-20 DIAGNOSIS — Z1231 Encounter for screening mammogram for malignant neoplasm of breast: Secondary | ICD-10-CM

## 2011-08-07 ENCOUNTER — Other Ambulatory Visit: Payer: Self-pay | Admitting: Pulmonary Disease

## 2011-09-19 ENCOUNTER — Encounter: Payer: Self-pay | Admitting: Pulmonary Disease

## 2011-09-20 ENCOUNTER — Ambulatory Visit (INDEPENDENT_AMBULATORY_CARE_PROVIDER_SITE_OTHER): Payer: PRIVATE HEALTH INSURANCE | Admitting: Pulmonary Disease

## 2011-09-20 ENCOUNTER — Encounter: Payer: Self-pay | Admitting: Pulmonary Disease

## 2011-09-20 VITALS — BP 102/60 | HR 69 | Temp 97.8°F | Ht 66.0 in | Wt 143.4 lb

## 2011-09-20 DIAGNOSIS — J45909 Unspecified asthma, uncomplicated: Secondary | ICD-10-CM

## 2011-09-20 NOTE — Progress Notes (Signed)
  Subjective:    Patient ID: Danielle Riddle, female    DOB: 28-Dec-1945, 66 y.o.   MRN: [id number]  HPI Patient is in today for followup of her known asthma.  She is taking Asmanex consistently, and has not had any flareups since her last visit a year ago.  She rarely uses her rescue inhaler, and does not have any nocturnal symptoms.   Review of Systems  Constitutional: Negative for fever and unexpected weight change.  HENT: Positive for congestion, rhinorrhea, postnasal drip and sinus pressure. Negative for ear pain, nosebleeds, sore throat, sneezing, trouble swallowing and dental problem.   Eyes: Negative for redness and itching.  Respiratory: Negative for cough, chest tightness, shortness of breath and wheezing.   Cardiovascular: Negative for palpitations and leg swelling.  Gastrointestinal: Negative for nausea and vomiting.  Genitourinary: Negative for dysuria.  Musculoskeletal: Negative for joint swelling.  Skin: Negative for rash.  Neurological: Negative for headaches.  Hematological: Does not bruise/bleed easily.  Psychiatric/Behavioral: Negative for dysphoric mood. The patient is not nervous/anxious.        Objective:   Physical Exam Well-developed female in no acute distress Chest totally clear to auscultation, no wheezing noted Cardiac exam is regular rate and rhythm Alert and oriented, moves all 4 extremities.       Assessment & Plan:

## 2011-09-20 NOTE — Assessment & Plan Note (Signed)
The patient is doing very well her current asthma medication.  She has not requiring rescue inhaler use, and is satisfied with her breathing.  I have asked her to continue on her current inhaler, and follow with me in one year.

## 2011-09-20 NOTE — Patient Instructions (Signed)
No change in meds. followup with me in one year.  

## 2011-12-14 ENCOUNTER — Ambulatory Visit: Payer: PRIVATE HEALTH INSURANCE | Admitting: Pulmonary Disease

## 2011-12-14 ENCOUNTER — Encounter: Payer: Self-pay | Admitting: Adult Health

## 2011-12-14 ENCOUNTER — Ambulatory Visit (INDEPENDENT_AMBULATORY_CARE_PROVIDER_SITE_OTHER): Payer: PRIVATE HEALTH INSURANCE | Admitting: Adult Health

## 2011-12-14 ENCOUNTER — Telehealth: Payer: Self-pay | Admitting: Pulmonary Disease

## 2011-12-14 VITALS — BP 128/66 | HR 77 | Temp 97.6°F | Ht 66.0 in | Wt 143.2 lb

## 2011-12-14 DIAGNOSIS — J209 Acute bronchitis, unspecified: Secondary | ICD-10-CM

## 2011-12-14 MED ORDER — HYDROCODONE-HOMATROPINE 5-1.5 MG/5ML PO SYRP: 5.0000 mL | ORAL_SOLUTION | Freq: Four times a day (QID) | ORAL | Status: DC | PRN

## 2011-12-14 MED ORDER — PREDNISONE 10 MG PO TABS: ORAL_TABLET | ORAL | Status: DC

## 2011-12-14 MED ORDER — AZITHROMYCIN 250 MG PO TABS: ORAL_TABLET | ORAL | Status: DC

## 2011-12-14 NOTE — Progress Notes (Signed)
  Subjective:    Patient ID: Danielle Riddle, female    DOB: 1945-10-30, 66 y.o.   MRN: [id number]  HPI 66 yo female with known hx of asthma   12/14/2011 Acute OV  Complains of wheezing, increased SOB, chest congestion, cough, tightness in chest, chills x1week No otc used No fever or chest pain.  Cough is worse at night, keeping her up at night increaed SABA use for 2 days.  No recent travel or abx use     Review of Systems Constitutional:   No  weight loss, night sweats,  Fevers, chills, + fatigue, or  lassitude.  HEENT:   No headaches,  Difficulty swallowing,  Tooth/dental problems, or  Sore throat,                No sneezing, itching, ear ache,  +nasal congestion, post nasal drip,   CV:  No chest pain,  Orthopnea, PND, swelling in lower extremities, anasarca, dizziness, palpitations, syncope.   GI  No heartburn, indigestion, abdominal pain, nausea, vomiting, diarrhea, change in bowel habits, loss of appetite, bloody stools.   Resp:  ,  No coughing up of blood.   No chest wall deformity  Skin: no rash or lesions.  GU: no dysuria, change in color of urine, no urgency or frequency.  No flank pain, no hematuria   MS:  No joint pain or swelling.  No decreased range of motion.  No back pain.  Psych:  No change in mood or affect. No depression or anxiety.  No memory loss.         Objective:   Physical Exam GEN: A/Ox3; pleasant , NAD, well nourished   HEENT:  [place]/AT,  EACs-clear, TMs-wnl, NOSE-clear drainage  THROAT-clear, no lesions  NECK:  Supple w/ fair ROM; no JVD; normal carotid impulses w/o bruits; no thyromegaly or nodules palpated; no lymphadenopathy.  RESP  Coarse BS w/ exp wheeze no accessory muscle use, no dullness to percussion  CARD:  RRR, no m/r/g  , no peripheral edema, pulses intact, no cyanosis or clubbing.  GI:   Soft & nt; nml bowel sounds; no organomegaly or masses detected.  Musco: Warm bil, no deformities or joint swelling noted.   Neuro: alert,  no focal deficits noted.    Skin: Warm, no lesions or rashes         Assessment & Plan:

## 2011-12-14 NOTE — Patient Instructions (Signed)
Zpack take as directed.  Mucinex DM Twice daily  As needed  Cough/congesiton  Saline nasal rinses As needed   Prednisone taper over next week.  Fluids and rest  Hydromet 1-2 tsp every 4-6 hr As needed  Cough, may make you sleepy.  Please contact office for sooner follow up if symptoms do not improve or worsen or seek emergency care  follow up Dr. Clance as planned and As needed   

## 2011-12-14 NOTE — Telephone Encounter (Signed)
I spoke with Danielle Riddle and she states she has began to have some issues with her breathing and wants to discuss the next step. Since TP had an opening today at 11:30 she is coming in at that time

## 2011-12-16 ENCOUNTER — Inpatient Hospital Stay (HOSPITAL_COMMUNITY): Payer: PRIVATE HEALTH INSURANCE

## 2011-12-16 ENCOUNTER — Inpatient Hospital Stay (HOSPITAL_COMMUNITY)
Admission: EM | Admit: 2011-12-16 | Discharge: 2012-01-07 | DRG: 024 | Disposition: A | Discharge status: Home / Self Care | Payer: PRIVATE HEALTH INSURANCE | Source: Other Acute Inpatient Hospital | Attending: Neurosurgery | Admitting: Neurosurgery

## 2011-12-16 ENCOUNTER — Encounter (HOSPITAL_COMMUNITY): Payer: Self-pay | Admitting: *Deleted

## 2011-12-16 DIAGNOSIS — I609 Nontraumatic subarachnoid hemorrhage, unspecified: Secondary | ICD-10-CM

## 2011-12-16 DIAGNOSIS — R05 Cough: Secondary | ICD-10-CM

## 2011-12-16 DIAGNOSIS — J45909 Unspecified asthma, uncomplicated: Secondary | ICD-10-CM

## 2011-12-16 DIAGNOSIS — R519 Headache, unspecified: Secondary | ICD-10-CM | POA: Diagnosis present

## 2011-12-16 DIAGNOSIS — I6529 Occlusion and stenosis of unspecified carotid artery: Secondary | ICD-10-CM | POA: Diagnosis present

## 2011-12-16 DIAGNOSIS — G911 Obstructive hydrocephalus: Secondary | ICD-10-CM | POA: Diagnosis present

## 2011-12-16 DIAGNOSIS — I658 Occlusion and stenosis of other precerebral arteries: Secondary | ICD-10-CM | POA: Diagnosis present

## 2011-12-16 DIAGNOSIS — T380X5A Adverse effect of glucocorticoids and synthetic analogues, initial encounter: Secondary | ICD-10-CM | POA: Diagnosis not present

## 2011-12-16 DIAGNOSIS — R291 Meningismus: Secondary | ICD-10-CM | POA: Diagnosis not present

## 2011-12-16 DIAGNOSIS — K5900 Constipation, unspecified: Secondary | ICD-10-CM | POA: Diagnosis not present

## 2011-12-16 DIAGNOSIS — R51 Headache: Secondary | ICD-10-CM

## 2011-12-16 DIAGNOSIS — Z79899 Other long term (current) drug therapy: Secondary | ICD-10-CM

## 2011-12-16 DIAGNOSIS — I729 Aneurysm of unspecified site: Secondary | ICD-10-CM

## 2011-12-16 DIAGNOSIS — J019 Acute sinusitis, unspecified: Secondary | ICD-10-CM

## 2011-12-16 DIAGNOSIS — R112 Nausea with vomiting, unspecified: Secondary | ICD-10-CM | POA: Diagnosis present

## 2011-12-16 DIAGNOSIS — R059 Cough, unspecified: Secondary | ICD-10-CM

## 2011-12-16 DIAGNOSIS — R7309 Other abnormal glucose: Secondary | ICD-10-CM | POA: Diagnosis not present

## 2011-12-16 DIAGNOSIS — E871 Hypo-osmolality and hyponatremia: Secondary | ICD-10-CM | POA: Diagnosis not present

## 2011-12-16 DIAGNOSIS — J45901 Unspecified asthma with (acute) exacerbation: Secondary | ICD-10-CM

## 2011-12-16 DIAGNOSIS — J329 Chronic sinusitis, unspecified: Secondary | ICD-10-CM | POA: Diagnosis present

## 2011-12-16 LAB — COMPREHENSIVE METABOLIC PANEL
ALT: 15 U/L (ref 0–35)
AST: 29 U/L (ref 0–37)
Albumin: 3.6 g/dL (ref 3.5–5.2)
CO2: 30 mEq/L (ref 19–32)
Chloride: 99 mEq/L (ref 96–112)
Creatinine, Ser: 0.62 mg/dL (ref 0.50–1.10)
GFR calc non Af Amer: >90 mL/min (ref >90)
Potassium: 3.4 mEq/L — ABNORMAL LOW (ref 3.5–5.1)
Sodium: 137 mEq/L (ref 135–145)
Total Bilirubin: 0.1 mg/dL — ABNORMAL LOW (ref 0.3–1.2)

## 2011-12-16 LAB — CBC
HCT: 37.2 % (ref 36.0–46.0)
Hemoglobin: 12.4 g/dL (ref 12.0–15.0)
MCHC: 33.3 g/dL (ref 30.0–36.0)
MCV: 94.4 fL (ref 78.0–100.0)

## 2011-12-16 LAB — DIFFERENTIAL
Basophils Relative: 0 % (ref 0–1)
Eosinophils Relative: 0 % (ref 0–5)
Lymphocytes Relative: 12 % (ref 12–46)
Monocytes Absolute: 0.8 10*3/uL (ref 0.1–1.0)
Monocytes Relative: 10 % (ref 3–12)
Neutro Abs: 6.6 10*3/uL (ref 1.7–7.7)

## 2011-12-16 LAB — GLUCOSE, CAPILLARY
Glucose-Capillary: 157 mg/dL — ABNORMAL HIGH (ref 70–99)
Glucose-Capillary: 144 mg/dL — ABNORMAL HIGH (ref 70–99)
Glucose-Capillary: 141 mg/dL — ABNORMAL HIGH (ref 70–99)

## 2011-12-16 LAB — PHOSPHORUS: Phosphorus: 3.8 mg/dL (ref 2.3–4.6)

## 2011-12-16 LAB — APTT: aPTT: 25 seconds (ref 24–37)

## 2011-12-16 MED ORDER — INSULIN ASPART 100 UNIT/ML ~~LOC~~ SOLN
0.0000 [IU] | Freq: Three times a day (TID) | SUBCUTANEOUS | Status: DC
  Administered 2011-12-17: 1 [IU] via SUBCUTANEOUS

## 2011-12-16 MED ORDER — IPRATROPIUM BROMIDE 0.02 % IN SOLN: 0.5000 mg | RESPIRATORY_TRACT | Status: DC | PRN

## 2011-12-16 MED ORDER — PREDNISONE 20 MG PO TABS
30.0000 mg | ORAL_TABLET | Freq: Every day | ORAL | Status: AC
  Administered 2011-12-16 – 2011-12-18 (×3): 30 mg via ORAL
  Filled 2011-12-16 (×3): qty 1

## 2011-12-16 MED ORDER — ONDANSETRON HCL 4 MG/2ML IJ SOLN: 4.0000 mg | Freq: Four times a day (QID) | INTRAMUSCULAR | Status: DC | PRN

## 2011-12-16 MED ORDER — ALBUTEROL SULFATE (5 MG/ML) 0.5% IN NEBU: 2.5000 mg | INHALATION_SOLUTION | RESPIRATORY_TRACT | Status: DC | PRN

## 2011-12-16 MED ORDER — FLUMAZENIL 0.5 MG/5ML IV SOLN
INTRAVENOUS | Status: AC
  Administered 2011-12-16: 0.2 mg via INTRAVENOUS
  Filled 2011-12-16: qty 5

## 2011-12-16 MED ORDER — FLUMAZENIL 0.5 MG/5ML IV SOLN
INTRAVENOUS | Status: AC
  Administered 2011-12-16: 0.4 mg via INTRAVENOUS
  Filled 2011-12-16: qty 5

## 2011-12-16 MED ORDER — MORPHINE SULFATE 2 MG/ML IJ SOLN
1.0000 mg | INTRAMUSCULAR | Status: DC | PRN
  Administered 2011-12-16 (×3): 2 mg via INTRAVENOUS
  Administered 2011-12-16: 4 mg via INTRAVENOUS
  Administered 2011-12-16: 2 mg via INTRAVENOUS
  Administered 2011-12-16: 4 mg via INTRAVENOUS
  Administered 2011-12-17 (×6): 2 mg via INTRAVENOUS
  Administered 2011-12-17: 4 mg via INTRAVENOUS
  Administered 2011-12-18 – 2011-12-19 (×11): 2 mg via INTRAVENOUS
  Administered 2011-12-20 (×2): 4 mg via INTRAVENOUS
  Administered 2011-12-21 (×3): 2 mg via INTRAVENOUS
  Administered 2011-12-21 (×3): 4 mg via INTRAVENOUS
  Administered 2011-12-22: 2 mg via INTRAVENOUS
  Administered 2011-12-22 – 2011-12-24 (×10): 4 mg via INTRAVENOUS
  Administered 2011-12-24: 2 mg via INTRAVENOUS
  Administered 2011-12-24 – 2011-12-25 (×8): 4 mg via INTRAVENOUS
  Administered 2011-12-25: 2 mg via INTRAVENOUS
  Administered 2011-12-25 – 2011-12-28 (×40): 4 mg via INTRAVENOUS
  Administered 2011-12-29: 2 mg via INTRAVENOUS
  Administered 2011-12-29 (×2): 4 mg via INTRAVENOUS
  Administered 2011-12-29 (×2): 2 mg via INTRAVENOUS
  Administered 2011-12-30: 4 mg via INTRAVENOUS
  Administered 2011-12-30: 2 mg via INTRAVENOUS
  Administered 2011-12-30: 4 mg via INTRAVENOUS
  Administered 2011-12-30: 2 mg via INTRAVENOUS
  Administered 2011-12-31 (×2): 4 mg via INTRAVENOUS
  Administered 2011-12-31: 2 mg via INTRAVENOUS
  Administered 2011-12-31 – 2012-01-02 (×13): 4 mg via INTRAVENOUS
  Administered 2012-01-02: 2 mg via INTRAVENOUS
  Administered 2012-01-02: 4 mg via INTRAVENOUS
  Administered 2012-01-02: 2 mg via INTRAVENOUS
  Administered 2012-01-02 – 2012-01-03 (×3): 4 mg via INTRAVENOUS
  Filled 2011-12-16 (×3): qty 1
  Filled 2011-12-16 (×4): qty 2
  Filled 2011-12-16: qty 1
  Filled 2011-12-16 (×12): qty 2
  Filled 2011-12-16: qty 1
  Filled 2011-12-16 (×10): qty 2
  Filled 2011-12-16: qty 1
  Filled 2011-12-16 (×3): qty 2
  Filled 2011-12-16: qty 1
  Filled 2011-12-16 (×2): qty 2
  Filled 2011-12-16 (×2): qty 1
  Filled 2011-12-16 (×2): qty 2
  Filled 2011-12-16: qty 1
  Filled 2011-12-16: qty 2
  Filled 2011-12-16: qty 1
  Filled 2011-12-16: qty 2
  Filled 2011-12-16 (×2): qty 1
  Filled 2011-12-16: qty 2
  Filled 2011-12-16: qty 1
  Filled 2011-12-16 (×3): qty 2
  Filled 2011-12-16: qty 1
  Filled 2011-12-16 (×5): qty 2
  Filled 2011-12-16 (×2): qty 1
  Filled 2011-12-16: qty 2
  Filled 2011-12-16: qty 1
  Filled 2011-12-16 (×6): qty 2
  Filled 2011-12-16 (×2): qty 1
  Filled 2011-12-16 (×5): qty 2
  Filled 2011-12-16: qty 1
  Filled 2011-12-16 (×4): qty 2
  Filled 2011-12-16 (×2): qty 1
  Filled 2011-12-16 (×2): qty 2
  Filled 2011-12-16: qty 1
  Filled 2011-12-16: qty 2
  Filled 2011-12-16: qty 1
  Filled 2011-12-16 (×5): qty 2
  Filled 2011-12-16 (×2): qty 1
  Filled 2011-12-16 (×4): qty 2
  Filled 2011-12-16: qty 1
  Filled 2011-12-16 (×6): qty 2
  Filled 2011-12-16 (×2): qty 1
  Filled 2011-12-16 (×3): qty 2
  Filled 2011-12-16 (×2): qty 1
  Filled 2011-12-16 (×12): qty 2
  Filled 2011-12-16: qty 1
  Filled 2011-12-16 (×3): qty 2
  Filled 2011-12-16: qty 1

## 2011-12-16 MED ORDER — SENNOSIDES-DOCUSATE SODIUM 8.6-50 MG PO TABS: 1.0000 | ORAL_TABLET | Freq: Two times a day (BID) | ORAL | Status: DC

## 2011-12-16 MED ORDER — NIMODIPINE 30 MG PO CAPS
60.0000 mg | ORAL_CAPSULE | ORAL | Status: DC
Start: 2011-12-16 — End: 2012-01-07
  Administered 2011-12-16 – 2012-01-07 (×131): 60 mg via ORAL
  Filled 2011-12-16 (×148): qty 2

## 2011-12-16 MED ORDER — MORPHINE SULFATE 10 MG/ML IJ SOLN
INTRAMUSCULAR | Status: AC | PRN
  Administered 2011-12-16: 2 mg via INTRAVENOUS

## 2011-12-16 MED ORDER — MORPHINE SULFATE 4 MG/ML IJ SOLN
INTRAMUSCULAR | Status: AC
  Filled 2011-12-16: qty 1

## 2011-12-16 MED ORDER — NIMODIPINE 30 MG PO CAPS: 60.0000 mg | ORAL_CAPSULE | ORAL | Status: DC

## 2011-12-16 MED ORDER — AZITHROMYCIN 500 MG IV SOLR
500.0000 mg | INTRAVENOUS | Status: AC
  Administered 2011-12-16 – 2011-12-17 (×2): 500 mg via INTRAVENOUS
  Filled 2011-12-16 (×2): qty 500

## 2011-12-16 MED ORDER — HYDROMORPHONE HCL PF 1 MG/ML IJ SOLN
INTRAMUSCULAR | Status: AC
  Filled 2011-12-16: qty 1

## 2011-12-16 MED ORDER — CHLORHEXIDINE GLUCONATE 0.12 % MT SOLN: 15.0000 mL | Freq: Two times a day (BID) | OROMUCOSAL | Status: DC

## 2011-12-16 MED ORDER — MIDAZOLAM HCL 5 MG/5ML IJ SOLN
INTRAMUSCULAR | Status: AC | PRN
  Administered 2011-12-16 (×2): 1 mg via INTRAVENOUS

## 2011-12-16 MED ORDER — FENTANYL CITRATE 0.05 MG/ML IJ SOLN
INTRAMUSCULAR | Status: AC | PRN
  Administered 2011-12-16 (×4): 25 ug via INTRAVENOUS

## 2011-12-16 MED ORDER — NALOXONE HCL 1 MG/ML IJ SOLN
INTRAMUSCULAR | Status: AC
  Administered 2011-12-16: 0.2 mg via INTRAVENOUS
  Filled 2011-12-16: qty 2

## 2011-12-16 MED ORDER — SODIUM CHLORIDE 0.9 % IV SOLN
INTRAVENOUS | Status: DC
  Administered 2011-12-16: 75 mL/h via INTRAVENOUS
  Administered 2011-12-18 – 2011-12-21 (×9): via INTRAVENOUS

## 2011-12-16 MED ORDER — PNEUMOCOCCAL VAC POLYVALENT 25 MCG/0.5ML IJ INJ
0.5000 mL | INJECTION | INTRAMUSCULAR | Status: AC
  Administered 2011-12-17: 0.5 mL via INTRAMUSCULAR
  Filled 2011-12-16: qty 0.5

## 2011-12-16 MED ORDER — ACETAMINOPHEN-CODEINE #3 300-30 MG PO TABS
1.0000 | ORAL_TABLET | ORAL | Status: DC | PRN
  Administered 2011-12-16 – 2011-12-19 (×11): 2 via ORAL
  Filled 2011-12-16 (×11): qty 2

## 2011-12-16 MED ORDER — HYDROMORPHONE HCL PF 1 MG/ML IJ SOLN
INTRAMUSCULAR | Status: AC | PRN
  Administered 2011-12-16: 1 mg

## 2011-12-16 MED ORDER — BIOTENE DRY MOUTH MT LIQD
15.0000 mL | Freq: Two times a day (BID) | OROMUCOSAL | Status: DC
  Administered 2011-12-16 (×2): 15 mL via OROMUCOSAL

## 2011-12-16 MED ORDER — LABETALOL HCL 5 MG/ML IV SOLN: 10.0000 mg | INTRAVENOUS | Status: DC | PRN

## 2011-12-16 MED ORDER — ONDANSETRON HCL 4 MG/2ML IJ SOLN
4.0000 mg | Freq: Four times a day (QID) | INTRAMUSCULAR | Status: DC | PRN
  Administered 2011-12-16 – 2012-01-06 (×7): 4 mg via INTRAVENOUS
  Filled 2011-12-16 (×6): qty 2

## 2011-12-16 MED ORDER — ACETAMINOPHEN 650 MG RE SUPP: 650.0000 mg | RECTAL | Status: DC | PRN

## 2011-12-16 MED ORDER — INSULIN ASPART 100 UNIT/ML ~~LOC~~ SOLN: 0.0000 [IU] | SUBCUTANEOUS | Status: DC

## 2011-12-16 MED ORDER — FLUTICASONE PROPIONATE HFA 220 MCG/ACT IN AERO
1.0000 | INHALATION_SPRAY | Freq: Every day | RESPIRATORY_TRACT | Status: DC
  Administered 2011-12-17 – 2011-12-18 (×2): 1 via RESPIRATORY_TRACT
  Filled 2011-12-16: qty 12

## 2011-12-16 MED ORDER — PANTOPRAZOLE SODIUM 40 MG IV SOLR
40.0000 mg | Freq: Every day | INTRAVENOUS | Status: DC
  Administered 2011-12-16 – 2011-12-18 (×3): 40 mg via INTRAVENOUS
  Filled 2011-12-16 (×4): qty 40

## 2011-12-16 MED ORDER — SODIUM CHLORIDE 0.9 % IV SOLN
500.0000 mg | Freq: Two times a day (BID) | INTRAVENOUS | Status: DC
  Administered 2011-12-16 – 2011-12-19 (×7): 500 mg via INTRAVENOUS
  Filled 2011-12-16 (×8): qty 5

## 2011-12-16 MED ORDER — NIMODIPINE 30 MG/ML ORAL SOLUTION
60.0000 mg | ORAL | Status: DC
  Administered 2011-12-16: 60 mg
  Filled 2011-12-16 (×27): qty 2

## 2011-12-16 MED ORDER — DEXTROSE 10 % IV SOLN: INTRAVENOUS | Status: DC

## 2011-12-16 MED ORDER — NICARDIPINE HCL IN NACL 20-0.86 MG/200ML-% IV SOLN
5.0000 mg/h | INTRAVENOUS | Status: DC
  Filled 2011-12-16 (×3): qty 200

## 2011-12-16 MED ORDER — SENNOSIDES-DOCUSATE SODIUM 8.6-50 MG PO TABS
1.0000 | ORAL_TABLET | Freq: Two times a day (BID) | ORAL | Status: DC
  Administered 2011-12-16 – 2012-01-06 (×38): 1 via ORAL
  Filled 2011-12-16 (×46): qty 1

## 2011-12-16 MED ORDER — NIMODIPINE 30 MG/ML ORAL SOLUTION: 60.0000 mg | ORAL | Status: DC

## 2011-12-16 MED ORDER — ACETAMINOPHEN 325 MG PO TABS: 650.0000 mg | ORAL_TABLET | ORAL | Status: DC | PRN

## 2011-12-16 MED ORDER — ACETAMINOPHEN 325 MG PO TABS
650.0000 mg | ORAL_TABLET | ORAL | Status: DC | PRN
  Administered 2012-01-06 (×2): 650 mg via ORAL
  Filled 2011-12-16 (×2): qty 2

## 2011-12-16 MED ORDER — PANTOPRAZOLE SODIUM 40 MG IV SOLR: 40.0000 mg | Freq: Every day | INTRAVENOUS | Status: DC

## 2011-12-16 MED ORDER — MORPHINE SULFATE 4 MG/ML IJ SOLN
INTRAMUSCULAR | Status: AC
  Administered 2011-12-16: 4 mg
  Filled 2011-12-16: qty 1

## 2011-12-16 MED ORDER — ONDANSETRON HCL 4 MG/2ML IJ SOLN
INTRAMUSCULAR | Status: AC
  Administered 2011-12-16: 4 mg via INTRAVENOUS
  Filled 2011-12-16: qty 2

## 2011-12-16 NOTE — Progress Notes (Signed)
Pt transferred/transported to Interventional Radiology.

## 2011-12-16 NOTE — Progress Notes (Signed)
PT/OT Cancellation Note  Treatment cancelled today due to medical issues with patient which prohibited therapy.  Pt currently on bedrest and RN request to hold today and re-attempt PT/OT evaluations tomorrow (12/17/11).    Danielle Riddle 12/16/2011, 12:17 PM Jake Shark, PT DPT [phone]

## 2011-12-16 NOTE — H&P (Signed)
Subjective: Patient is a 66 y.o. right handed female admitted with a subarachnoid hemorrhage. Patient's symptoms include headaches and nausea, vomiting, photophobia and confusion.. Onset of symptoms was abrupt starting earlier today. She was recently seen by Dr. Marcelyn Bruins for sinusitis and was started on prednisone and a Z-pack a few days ago.  She went to [facility] ER tonight after sudden onset of severe headache with nausea and vomiting.  She has a history of migraines and a remote history of head injury from a fall off a horse.  Patient Active Problem List  Diagnoses Date Noted  . SINUSITIS, ACUTE 09/14/2010  . BRONCHITIS, ACUTE 07/15/2010  . INTRINSIC ASTHMA, WITH EXACERBATION 07/15/2010  . Asthma, intrinsic 07/15/2010   Past Medical History  Diagnosis Date  . Asthma   . Nasal polyps     Past Surgical History  Procedure Date  . Tubal ligation   . Nasal sinus surgery     Prescriptions prior to admission  Medication Sig Dispense Refill  . albuterol (VENTOLIN HFA) 108 (90 BASE) MCG/ACT inhaler Inhale 2 puffs into the lungs every 6 (six) hours as needed.      Marland Kitchen azithromycin (ZITHROMAX Z-PAK) 250 MG tablet Take 2 tablets (500 mg) on  Day 1,  followed by 1 tablet (250 mg) once daily on Days 2 through 5.  6 each  0  . HYDROcodone-homatropine (HYDROMET) 5-1.5 MG/5ML syrup Take 5 mLs by mouth every 6 (six) hours as needed for cough.  240 mL  0  . mometasone (ASMANEX 60 METERED DOSES) 220 MCG/INH inhaler Inhale 1 puff into the lungs at bedtime.       . predniSONE (DELTASONE) 10 MG tablet 4 tabs for 2 days, then 3 tabs for 2 days, 2 tabs for 2 days, then 1 tab for 2 days, then stop  20 tablet  0   No Known Allergies  History  Substance Use Topics  . Smoking status: Former Smoker -- 0.1 packs/day for 2 years    Types: Cigarettes    Quit date: 08/15/1965  . Smokeless tobacco: Not on file  . Alcohol Use: Not on file    Family History  Problem Relation Age of Onset  . Asthma Mother   .  Allergies Mother     Review of Systems Pertinent items are noted in HPI.  Objective: Vital signs in last 24 hours:    Patient is photophobic with c/o headache.  Keeps her eyes shut, but will open them, attend, follow commands. She is nauseated and is vomiting. Pupils are equal, round, reactive to light. EOMI.  Mild meningismus.  Face symmetric.  MAEW without drift.  Reflexes symmetric.  Chest CTA, Cor RRRs Murmur, Abd soft, NT, no HSM. Exts without E/C/C.  Data Review Head CT shows significant subarachnoid hemorrhage with blood in basilar cisterns and incipient, but not significant hydrocephalus.   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  Will require angiography and likely intervention (coiling vs. Clipping of aneurysm).  CCM consult.  I explained the condition and potential surgical options to the patient and her husband and answered any questions.    Dorian Heckle, MD 12/16/2011 2:15 AM

## 2011-12-16 NOTE — Evaluation (Signed)
Speech Language Pathology Evaluation Patient Details Name: Danielle Riddle MRN: [id number] DOB: 1946-08-15 Today's Date: 12/16/2011 Time: 1005-1030 SLP Time Calculation (min): 25 min  Problem List:  Patient Active Problem List  Diagnoses  . BRONCHITIS, ACUTE  . INTRINSIC ASTHMA, WITH EXACERBATION  . Asthma, intrinsic  . SINUSITIS, ACUTE   Past Medical History:  Past Medical History  Diagnosis Date  . Asthma   . Nasal polyps    Past Surgical History:  Past Surgical History  Procedure Date  . Tubal ligation   . Nasal sinus surgery     Assessment / Plan / Recommendation Clinical Impression  Patient seen for cognitive-linguistic evaluation. Per RN, patient received morphine this morning and was admitted around 3 am this morning impacting overall degree of alertness however patient has previously been easily able to arouse, oriented x 4 and with good awareness of situation. During this SLPs evaluation however, patient presents with significant deficits in the areas of arousal and focused attention, requiring max verbal and tactile stimulation to arouse and maintain level of arousal. Patient was able to follow basic 1 step directions in <25% of trials, limited verbal output (appropriate in 75% of episodes). RN informed and questioning change in status. Plans for repeat head CT. SLP will continue to f/u for cognitive linguistic treatment and differential diagnosis of deficits pending CT.    SLP Assessment  Patient needs continued Speech Lanaguage Pathology Services    Follow Up Recommendations  Other (comment) (TBD pending f/u head CT and differential diagnosis)    Frequency and Duration min 2x/week  2 weeks   Pertinent Vitals/Pain Vitals remained [facility]   SLP Goals  SLP Goals Potential to Achieve Goals: Good Potential Considerations: Other (comment) (results of repeat head CT; ? neuro change) Progress/Goals/Alternative treatment plan discussed with pt/caregiver and they: Patient  unable to parrticipate in goal setting SLP Goal #1: Patient will focus attention to clinician provided verbal and tactile cues in 75% of attempts SLP Goal #1 - Progress: Not met SLP Goal #2: Patient will follow basic 1 step commands related to functional ADLs with min assist. SLP Goal #2 - Progress: Not met SLP Goal #3: Patient will answer orientation questions x 4 with moderate clincian questioning cues.  SLP Goal #3 - Progress: Not met  SLP Evaluation Prior Functioning  Cognitive/Linguistic Baseline: Within functional limits (h/o remote brain injury; no residual deficits reported by RN)   Cognition  Overall Cognitive Status: Impaired Arousal/Alertness: Lethargic (difficult to arouse; max verbal and tactile stimulation) Orientation Level: Oriented to person Attention: Focused Focused Attention: Impaired Focused Attention Impairment: Verbal basic Memory:  (TBD) Awareness: Impaired Awareness Impairment: Intellectual impairment Problem Solving:  (TBD)    Comprehension  Auditory Comprehension Overall Auditory Comprehension: Impaired Yes/No Questions: Not tested Commands: Impaired One Step Basic Commands: 0-24% accurate Interfering Components: Attention;Other (comment) (significant lethargy) Visual Recognition/Discrimination Discrimination: Not tested Reading Comprehension Reading Status: Not tested    Expression Expression Primary Mode of Expression: Verbal Verbal Expression Overall Verbal Expression: Impaired Initiation: Impaired Automatic Speech: Name ("what?!" in response to clinician tactile stimulation) Level of Generative/Spontaneous Verbalization: Phrase Interfering Components: Attention (significant lethargy)   Oral / Motor Oral Motor/Sensory Function Overall Oral Motor/Sensory Function:  (NT) Motor Speech Overall Motor Speech:  (unable to assess due to limited verbal output)    Danielle Lango MA, CCC-SLP [phone]  Danielle Riddle 12/16/2011, 10:53 AM

## 2011-12-16 NOTE — Progress Notes (Signed)
Subjective: Patient reports improved headache, but persistent n/v  Objective: Vital signs in last 24 hours: Temp:  [97.5 F (36.4 C)-97.8 F (36.6 C)] 97.8 F (36.6 C) (05/03 0800) Pulse Rate:  [46-90] 57  (05/03 0800) Resp:  [14-23] 18  (05/03 0800) BP: (102-145)/(53-81) 105/60 mmHg (05/03 0800) SpO2:  [98 %-100 %] 99 % (05/03 0800) Weight:  [63.4 kg (139 lb 12.4 oz)] 63.4 kg (139 lb 12.4 oz) (05/03 0300)  Intake/Output from previous day: 05/02 0701 - 05/03 0700 In: 655 [I.V.:300; IV Piggyback:355] Out: -  Intake/Output this shift:    Alert, conversant. Reports h/a decreased. VSS.   Lab Results:  [facility] 12/16/11 0259  WBC 8.5  HGB 12.4  HCT 37.2  PLT 170   BMET  Basename 12/16/11 0259  NA 137  K 3.4*  CL 99  CO2 30  GLUCOSE 194*  BUN 17  CREATININE 0.62  CALCIUM 8.8    Studies/Results: Portable Chest Xray  12/16/2011  *RADIOLOGY REPORT*  Clinical Data: Subarachnoid hemorrhage, preoperative radiograph  PORTABLE CHEST - 1 VIEW  Comparison: 09/25/2006  Findings: Prominent pulmonary vessels.  Heart size upper normal. Mild lingular opacity.  No pneumothorax.  No acute osseous abnormality.  IMPRESSION:  Mild lingular opacity, atelectasis versus infiltrate.  Original Report Authenticated By: Waneta Martins, M.D.    Assessment/Plan: Improving slowly, expected to take ~week for s/s to dissipate  LOS: 0 days  Continue tx of s/s, may advance diet, proceed with transcranial dopplers today, then M-W-F per Dr. Venetia Maxon.   Georgiann Cocker 12/16/2011, 8:08 AM

## 2011-12-16 NOTE — Progress Notes (Addendum)
Admission orders entered. PCCM to consult and help manage SAH with neurosurgery.  Full PCCM note to follow See orders  [facility]  [phone]  Cell  [phone]  If no response or cell goes to voicemail, call beeper [phone]

## 2011-12-16 NOTE — Progress Notes (Addendum)
Transcranial Doppler  Date POD PCO2 HCT BP  MCA ACA PCA OPHT SIPH VERT Basilar  5-3 hc 0 none 5-3 0259 37.2 5-3 1800 106/60 Right  Left   52  57   -37  -47   30     19  15   28   35   -23  -35     -32    5-6 SB     Right  Left   69  93   -57  -50   26  27   19  18    32  33   -32  -30     -45    5-8 SB     Right  Left     71  68   -35  -45     28   20  17    35  42   -26  -21     -44    5-10 VS     Right  Left   35        69    -27 -42 29      33 21       17  37         35 -86        -77  -50   5-13 VS     Right  Left   48  49   -34  -29   40  46   27  17   23  22    -47  -92   -35      5-15 VS     Right  Left   59  52   -32  -33   30  29   21  19    32  24   -88  -25   -35      5-17 VS     Right  Left   64  60   -29  -31   29  34   22  20   24  29    -82  -85   -52       MCA = Middle Cerebral Artery      OPHT = Opthalmic Artery     BASILAR = Basilar Artery   ACA = Anterior Cerebral Artery     SIPH = Carotid Siphon PCA = Posterior Cerebral Artery   VERT = Verterbral Artery                   Normal MCA = 62+\-12 ACA = 50+\-12 PCA = 42+\-23   CONTINUED BELOW   Transcranial Doppler  Date POD PCO2 HCT BP  MCA ACA PCA OPHT SIPH VERT Basilar  5-20 SB     Right  Left   55  91   -64       53   24  17   32  40   -21  -21     -54    5-24 SB     Right  Left   38  84   -38  -28     28/32   14  15   29   35   -25  -27     -50         Right  Left  Right  Left                                             Right  Left                                            Right  Left                                            Right  Left                                        MCA = Middle Cerebral Artery      OPHT = Opthalmic Artery     BASILAR = Basilar Artery     ACA = Anterior Cerebral Artery     SIPH = Carotid Siphon PCA = Posterior Cerebral Artery   VERT = Verterbral Artery                   Normal MCA = 62+\-12 ACA = 50+\-12 PCA = 42+\-23

## 2011-12-16 NOTE — Procedures (Signed)
Preoperative diagnosis: Hydrocephalus  Post procedure diagnosis: Hydrocephalus  Procedure: Right frontal ventriculostomy placement  Surgeon: Jillyn Hidden Georg Ang  Anesthesia: Local  History of present illness : Patient is a 66 year old female who presented with a spontaneous subarachnoid hemorrhage and ventriculomegaly overnight the patient is a tolerated very well however start declining a mental status and bone scan in the morning of hospital day 1 bone scan showed dilation of her ventricles and in the setting of a declining clinical exam and CT findings of worsening hydrocephalus I. patient's family were recommended extra-articular drain placement risks and benefits of the placement were explained the patient and her husband over the phone and they agree to proceed forward the procedure.  Operative procedure: After some mild sedation with 4 mg morphine the right side the patient's frontal scalp was shaved prepped and draped in routine sterile fashion to point was identified and infiltrated with 5 cc lidocaine with epi a stab incision was made and a burr hole was drilled the dura was then pierced with a spinal needle and catheter was placed approximately 6 cm pick up his point aiming for the medial canthus of the ipsilateral eye from the T. maxillary intersection treatment intersection between the internal auditory canal. Immediate CSF under pressure was achieved this was then tunneled the incision was sutured the drain was sutured in place and hooked up to the external carotid system. Patient's clinical exam and medially improved the entry site was then dressed. In the procedure all sharps were disposed of and all counts were correct.

## 2011-12-16 NOTE — ED Notes (Signed)
Narcan and Romazicon given to pt per MD instruction in order to allow pt to follow breathing commands appropriately.  VS remained stable ongoing.

## 2011-12-16 NOTE — Consult Note (Signed)
Name: Danielle Riddle MRN: [id number] DOB: [birth year]    LOS: 0  PCCM CONSULTATION NOTE   History of Present Illness: 66 yo without significant medical history recently treated for sinusitis and asthma exacerbation with Z-pack and Prednisone who presented today with headache, nausea, vomiting, photophobia and confusion.  Head CT revealed subarachnoid hemorrhage.  The patient was unable to provide detailed history which was obtained form her husband and medical records.  Lines / Drains: None  Cultures: None  Antibiotics: 5/1  Azithromycin  Tests / Events: 5/3  Head CT >>> Significant subarachnoid hemorrhage with blood in basilar cisterns and incipient, but not significant hydrocephalus.  Past Medical History  Diagnosis Date  . Asthma   . Nasal polyps    Past Surgical History  Procedure Date  . Tubal ligation   . Nasal sinus surgery    Prior to Admission medications   Medication Sig Start Date End Date Taking? Authorizing Provider  albuterol (VENTOLIN HFA) 108 (90 BASE) MCG/ACT inhaler Inhale 2 puffs into the lungs every 6 (six) hours as needed.    Historical Provider, MD  azithromycin (ZITHROMAX Z-PAK) 250 MG tablet Take 2 tablets (500 mg) on  Day 1,  followed by 1 tablet (250 mg) once daily on Days 2 through 5. 12/14/11 12/19/11  Tammy S Parrett, NP  HYDROcodone-homatropine (HYDROMET) 5-1.5 MG/5ML syrup Take 5 mLs by mouth every 6 (six) hours as needed for cough. 12/14/11 12/24/11  Tammy S Parrett, NP  mometasone (ASMANEX 60 METERED DOSES) 220 MCG/INH inhaler Inhale 1 puff into the lungs at bedtime.  08/07/11   Barbaraann Share, MD  predniSONE (DELTASONE) 10 MG tablet 4 tabs for 2 days, then 3 tabs for 2 days, 2 tabs for 2 days, then 1 tab for 2 days, then stop 12/14/11   Julio Sicks, NP   Allergies No Known Allergies  Family History Family History  Problem Relation Age of Onset  . Asthma Mother   . Allergies Mother    Social History  reports that she quit smoking about 46  years ago. Her smoking use included Cigarettes. She has a .2 pack-year smoking history. She does not have any smokeless tobacco history on file. Her alcohol and drug histories not on file.  Review Of Systems  Patient unable to provide  Vital Signs:     Physical Examination: General:  No acute distress Neuro:  Limited exam, sleepy but arouses to stimulation HEENT:  Keeps eyes shut, but opens to command, PERRL Neck:  Supple, no JVD   Cardiovascular:  RRR, no M/R/G Lungs:  Bilateral diminished air entry, no W/R/R Abdomen:  Soft, nontender, nondistended, bowel sounds present Musculoskeletal:  No pedal edema Skin:  No rash  Ventilator settings:    Labs and Imaging:  No results found for this basename: HGB:3,HCT:3,WBC:3,PLT:3 in the last 168 hours  No results found for this basename: NA:5,K:2,CL:5,CO2:5,GLUCOSE:5,BUN:5,CREATININE:5,CALCIUM:5,MG:5,PHOS:5 in the last 168 hours   ASSESSMENT AND PLAN  Subarachnoid hemorrhage Asthma, no signs of acute bronchospasm Acute sinusitis -->  Azithromycin 500 mg IV x 2 more days -->  Continue tapering dose of Prednisone, now at 30 mg PO daily -->  Continue bronchodilators PRN -->  Flovent qHS as Asmanex is not available -->  SAH workup and treatment per Neurosurgery -->  SCDs for DVt Px -->  Protonix for GI Px -->  SSI/CBG  Orlean Bradford, M.D., F.C.C.P. Pulmonary and Critical Care Medicine [facility] Cell: [phone] Pager: [phone]  12/16/2011, 3:07 AM

## 2011-12-16 NOTE — Assessment & Plan Note (Signed)
Zpack take as directed.  Mucinex DM Twice daily  As needed  Cough/congesiton  Saline nasal rinses As needed   Prednisone taper over next week.  Fluids and rest  Hydromet 1-2 tsp every 4-6 hr As needed  Cough, may make you sleepy.  Please contact office for sooner follow up if symptoms do not improve or worsen or seek emergency care  follow up Dr. Shelle Iron as planned and As needed

## 2011-12-17 LAB — GLUCOSE, CAPILLARY: Glucose-Capillary: 126 mg/dL — ABNORMAL HIGH (ref 70–99)

## 2011-12-17 MED ORDER — DEXTROSE 5 % IV SOLN
1.0000 g | INTRAVENOUS | Status: DC
  Administered 2011-12-17 – 2012-01-03 (×18): 1 g via INTRAVENOUS
  Filled 2011-12-17 (×19): qty 10

## 2011-12-17 NOTE — Progress Notes (Signed)
Name: LUCIANNE SMESTAD MRN: [id number] DOB: 1946/04/08    LOS: 1  PCCM PROGRESS NOTE   History of Present Illness: 66 yo without significant medical history recently treated for sinusitis and asthma exacerbation with Z-pack and Prednisone who presented 5/3 with headache, nausea, vomiting, photophobia and confusion.  Head CT revealed subarachnoid hemorrhage.  The patient was unable to provide detailed history which was obtained form her husband and medical records. PCCM consulted for medical management while in ICU  Lines / Drains: R ventric drain 5/3>>>  Cultures: None  Antibiotics: 5/1  Azithromycin>>5/4  Tests / Events: 5/3  Head CT >>> Significant subarachnoid hemorrhage with blood in basilar cisterns and incipient, but not significant hydrocephalus.  Vital Signs: Temp:  [98 F (36.7 C)-98.8 F (37.1 C)] 98.8 F (37.1 C) (05/03 1543) Pulse Rate:  [55-63] 57  (05/04 0700) Resp:  [11-18] 12  (05/04 0700) BP: (97-113)/(52-65) 104/59 mmHg (05/04 0700) SpO2:  [89 %-100 %] 100 % (05/04 0700) Weight:  [146 lb 2.6 oz (66.3 kg)] 146 lb 2.6 oz (66.3 kg) (05/04 0400)  I/O last 3 completed shifts: In: 2915 [I.V.:2100; IV Piggyback:815] Out: 1997 [Urine:1800; Drains:197]  Physical Examination: General:  No acute distress Neuro:  Awake, alert, appropriate HEENT:  Keeps eyes shut, but opens to command, PERRL Neck:  Supple, no JVD   Cardiovascular:  RRR, no M/R/G Lungs:  resps even non labored on [place], no audible wheeze  Abdomen:  Soft, nontender, nondistended, bowel sounds present Musculoskeletal:  No pedal edema Skin:  No rash  Labs and Imaging:  Lab 12/16/11 0259  HGB 12.4  HCT 37.2  WBC 8.5  PLT 170    Lab 12/16/11 0259  NA 137  K 3.4*  CL 99  CO2 30  GLUCOSE 194*  BUN 17  CREATININE 0.62  CALCIUM 8.8  MG 2.0  PHOS 3.8   ASSESSMENT AND PLAN  Subarachnoid hemorrhage Asthma, no signs of acute bronchospasm Acute sinusitis  -->  Azithromycin course  completed -->  Continue tapering dose of Prednisone, now at 30 mg PO daily -->  Continue bronchodilators PRN -->  Flovent qHS as Asmanex is not available -->  SAH workup and treatment per Neurosurgery -->  BP control  -->  SCDs for DVt Px -->  Protonix for GI Px -->  SSI/CBG  PCCM will f/u 5/6, please call sooner if needed.   Danford Bad, NP 12/17/2011  9:35 AM Pager: [phone]  Care during the described time interval was provided by me and/or other providers on the critical care team. I have reviewed this patient's available data, including medical history, events of note, physical examination and test results as part of my evaluation.  Orlean Bradford, M.D., F.C.C.P. Pulmonary and Critical Care Medicine [facility] Cell: [phone] Pager: [phone]

## 2011-12-17 NOTE — Progress Notes (Signed)
PT Cancellation Note  Treatment cancelled today due to medical issues with patient which prohibited therapy --bad HA  12/17/2011  Garner Bing, PT [phone] (pager)

## 2011-12-17 NOTE — Progress Notes (Signed)
Subjective: Patient reports Other than severe headache she feels better this morning a much more awake and alert she denies any vision trouble a numbness or tingling her arms or legs any significant vomiting she is occasionally have some nausea  Objective: Vital signs in last 24 hours: Temp:  [98 F (36.7 C)-98.8 F (37.1 C)] 98.8 F (37.1 C) (05/03 1543) Pulse Rate:  [55-63] 57  (05/04 0700) Resp:  [11-18] 12  (05/04 0700) BP: (97-113)/(51-65) 104/59 mmHg (05/04 0700) SpO2:  [89 %-100 %] 100 % (05/04 0700) Weight:  [66.3 kg (146 lb 2.6 oz)] 66.3 kg (146 lb 2.6 oz) (05/04 0400)  Intake/Output from previous day: 05/03 0701 - 05/04 0700 In: 2260 [I.V.:1800; IV Piggyback:460] Out: 1997 [Urine:1800; Drains:197] Intake/Output this shift:    She is awake she is alert strength is 5 of 5 with no pronator drift drains using functioning well putting out anywhere from 8-20 cc an hour.  Lab Results:  [facility] 12/16/11 0259  WBC 8.5  HGB 12.4  HCT 37.2  PLT 170   BMET  Basename 12/16/11 0259  NA 137  K 3.4*  CL 99  CO2 30  GLUCOSE 194*  BUN 17  CREATININE 0.62  CALCIUM 8.8    Studies/Results: Ct Head Wo Contrast  12/16/2011  *RADIOLOGY REPORT*  Clinical Data: Subarachnoid hemorrhage.  Increasing lethargy.  CT HEAD WITHOUT CONTRAST  Technique:  Contiguous axial images were obtained from the base of the skull through the vertex without contrast.  Comparison: 12/16/2011  Findings: Diffuse subarachnoid hemorrhage again noted, similar to prior study.  Increasing intraventricular blood, now layering in the posterior horns of the lateral ventricles.  Ventricular size has increased since prior study.  No midline shift.  No parenchymal hemorrhage.  Chronic sinusitis changes throughout the paranasal sinuses.  No acute calvarial abnormality.  IMPRESSION: Continued large diffuse subarachnoid hemorrhage.  Increasing intraventricular blood and ventricular size.  Original Report Authenticated By:  Cyndie Chime, M.D.   Portable Chest Xray  12/16/2011  *RADIOLOGY REPORT*  Clinical Data: Subarachnoid hemorrhage, preoperative radiograph  PORTABLE CHEST - 1 VIEW  Comparison: 09/25/2006  Findings: Prominent pulmonary vessels.  Heart size upper normal. Mild lingular opacity.  No pneumothorax.  No acute osseous abnormality.  IMPRESSION:  Mild lingular opacity, atelectasis versus infiltrate.  Original Report Authenticated By: Waneta Martins, M.D.    Assessment/Plan: Hospital day 2 subarachnoid hemorrhage post-bleed day 2 he is extraventricular drain day 1. Continue drainage will raise it from now 5 mm mercury to 10 cm water that she's draining a more regular basis. We'll continue to observe her with subarachnoid hemorrhage precautions.  LOS: 1 day     Icis Budreau P 12/17/2011, 8:48 AM

## 2011-12-17 NOTE — Progress Notes (Signed)
  Subjective: SAH 5/2 Cerebral arteriogram without intervention 5/3 Ventriculostomy now Pt some better; less confused Bad headache Less Nausea  Objective: Vital signs in last 24 hours: Temp:  [98 F (36.7 C)-98.8 F (37.1 C)] 98.8 F (37.1 C) (05/03 1543) Pulse Rate:  [55-63] 57  (05/04 0700) Resp:  [11-18] 12  (05/04 0700) BP: (97-113)/(52-65) 104/59 mmHg (05/04 0700) SpO2:  [92 %-100 %] 100 % (05/04 0700) Weight:  [146 lb 2.6 oz (66.3 kg)] 146 lb 2.6 oz (66.3 kg) (05/04 0400)    Intake/Output from previous day: 05/03 0701 - 05/04 0700 In: 2260 [I.V.:1800; IV Piggyback:460] Out: 1997 [Urine:1800; Drains:197] Intake/Output this shift:    VSS; afeb Ventric: 200 cc 5/3 25 cc out today; blood tinged Neuro intact Face symmetrical   Lab Results:   [facility] 12/16/11 0259  WBC 8.5  HGB 12.4  HCT 37.2  PLT 170   BMET  Basename 12/16/11 0259  NA 137  K 3.4*  CL 99  CO2 30  GLUCOSE 194*  BUN 17  CREATININE 0.62  CALCIUM 8.8   PT/INR  Basename 12/16/11 0259  LABPROT 13.7  INR 1.03   ABG No results found for this basename: PHART:2,PCO2:2,PO2:2,HCO3:2 in the last 72 hours  Studies/Results: Ct Head Wo Contrast  12/16/2011  *RADIOLOGY REPORT*  Clinical Data: Subarachnoid hemorrhage.  Increasing lethargy.  CT HEAD WITHOUT CONTRAST  Technique:  Contiguous axial images were obtained from the base of the skull through the vertex without contrast.  Comparison: 12/16/2011  Findings: Diffuse subarachnoid hemorrhage again noted, similar to prior study.  Increasing intraventricular blood, now layering in the posterior horns of the lateral ventricles.  Ventricular size has increased since prior study.  No midline shift.  No parenchymal hemorrhage.  Chronic sinusitis changes throughout the paranasal sinuses.  No acute calvarial abnormality.  IMPRESSION: Continued large diffuse subarachnoid hemorrhage.  Increasing intraventricular blood and ventricular size.  Original Report  Authenticated By: Cyndie Chime, M.D.   Portable Chest Xray  12/16/2011  *RADIOLOGY REPORT*  Clinical Data: Subarachnoid hemorrhage, preoperative radiograph  PORTABLE CHEST - 1 VIEW  Comparison: 09/25/2006  Findings: Prominent pulmonary vessels.  Heart size upper normal. Mild lingular opacity.  No pneumothorax.  No acute osseous abnormality.  IMPRESSION:  Mild lingular opacity, atelectasis versus infiltrate.  Original Report Authenticated By: Waneta Martins, M.D.    Anti-infectives: Anti-infectives     Start     Dose/Rate Route Frequency Ordered Stop   12/16/11 0400   azithromycin (ZITHROMAX) 500 mg in dextrose 5 % 250 mL IVPB        500 mg 250 mL/hr over 60 Minutes Intravenous Every 24 hours 12/16/11 0313 12/17/11 0506          Assessment/Plan: s/p SAH 5/2; angiogram 5/3- no intervention performed Headache  Ventriculostomy in place: blood tinged Dr Corliss Skains rec: cerebral arteriogram late next week    LOS: 1 day    Danielle Riddle A 12/17/2011

## 2011-12-17 NOTE — Progress Notes (Signed)
OT Cancellation Note  Treatment cancelled today due to medical issues with patient which prohibited therapy.  Pt with c/o of bad headache.  12/17/2011 Cipriano Mile OTR/L Pager [phone] Office [phone]

## 2011-12-18 ENCOUNTER — Inpatient Hospital Stay (HOSPITAL_COMMUNITY): Payer: PRIVATE HEALTH INSURANCE

## 2011-12-18 LAB — BASIC METABOLIC PANEL
CO2: 27 mEq/L (ref 19–32)
Chloride: 99 mEq/L (ref 96–112)
Creatinine, Ser: 0.61 mg/dL (ref 0.50–1.10)
Sodium: 132 mEq/L — ABNORMAL LOW (ref 135–145)

## 2011-12-18 LAB — CBC
HCT: 34.6 % — ABNORMAL LOW (ref 36.0–46.0)
MCV: 92.3 fL (ref 78.0–100.0)
RBC: 3.75 MIL/uL — ABNORMAL LOW (ref 3.87–5.11)
WBC: 5.1 10*3/uL (ref 4.0–10.5)

## 2011-12-18 MED ORDER — FLUTICASONE PROPIONATE 50 MCG/ACT NA SUSP
1.0000 | Freq: Every day | NASAL | Status: DC
  Administered 2011-12-18 – 2011-12-20 (×3): 1 via NASAL
  Filled 2011-12-18: qty 16

## 2011-12-18 MED ORDER — POTASSIUM CHLORIDE 10 MEQ/100ML IV SOLN
10.0000 meq | Freq: Once | INTRAVENOUS | Status: AC
  Administered 2011-12-18: 10 meq via INTRAVENOUS
  Filled 2011-12-18: qty 100

## 2011-12-18 MED ORDER — SODIUM CHLORIDE 0.9 % IV BOLUS (SEPSIS)
1000.0000 mL | Freq: Once | INTRAVENOUS | Status: AC
  Administered 2011-12-18: 1000 mL via INTRAVENOUS

## 2011-12-18 MED ORDER — GUAIFENESIN-DM 100-10 MG/5ML PO SYRP
5.0000 mL | ORAL_SOLUTION | ORAL | Status: DC | PRN
  Administered 2011-12-18 – 2011-12-23 (×5): 5 mL via ORAL
  Filled 2011-12-18 (×5): qty 5

## 2011-12-18 MED ORDER — ALBUMIN HUMAN 5 % IV SOLN
40.0000 mL/h | INTRAVENOUS | Status: DC
  Administered 2011-12-18 – 2011-12-20 (×8): 40 mL/h via INTRAVENOUS
  Filled 2011-12-18 (×21): qty 250

## 2011-12-18 NOTE — Progress Notes (Signed)
Patient received ordered IV Potassium and was very sensitive to it.  Complains of severe burning sensation via IV.  Please provide all future Potassium via PO route.

## 2011-12-18 NOTE — Progress Notes (Signed)
OT Cancellation Note  OT evaluation cancelled due to pt sill on bed rest.  Will re-attempt pending activity order update. Thanks!  12/18/2011 Cipriano Mile OTR/L Pager [phone] Office [phone]

## 2011-12-18 NOTE — Progress Notes (Signed)
PT Cancellation Note  Evaluation cancelled as pt still on bedrest, please update activity orders if pt appropriate for PT later today. Thanks!  Edwyna Dangerfield (Beverely Pace) Carleene Mains PT, DPT Acute Rehabilitation [phone]

## 2011-12-18 NOTE — Progress Notes (Signed)
Subjective: Patient reports That she feels better this morning her headaches much improved she's much more alert  Objective: Vital signs in last 24 hours: Temp:  [97.5 F (36.4 C)-98.1 F (36.7 C)] 97.5 F (36.4 C) (05/05 0800) Pulse Rate:  [57-71] 62  (05/05 0700) Resp:  [12-20] 13  (05/05 0700) BP: (87-118)/(38-71) 105/59 mmHg (05/05 0700) SpO2:  [93 %-97 %] 94 % (05/05 0700)  Intake/Output from previous day: 05/04 0701 - 05/05 0700 In: 3050 [I.V.:2050; IV Piggyback:1000] Out: 2902 [Urine:2700; Drains:202] Intake/Output this shift:    Neurologically she is awake alert she's oriented x4 pupils are equal strength is 5 out of 5 no pronator drift.  Lab Results:  Basename 12/18/11 0345 12/16/11 0259  WBC 5.1 8.5  HGB 11.8* 12.4  HCT 34.6* 37.2  PLT 186 170   BMET  Basename 12/18/11 0345 12/16/11 0259  NA 132* 137  K 3.3* 3.4*  CL 99 99  CO2 27 30  GLUCOSE 110* 194*  BUN 10 17  CREATININE 0.61 0.62  CALCIUM 8.3* 8.8    Studies/Results: Ct Head Wo Contrast  12/18/2011  *RADIOLOGY REPORT*  Clinical Data: Follow up subarachnoid hemorrhage.  Worsening headache.  CT HEAD WITHOUT CONTRAST 12/18/2011:  Technique:  Contiguous axial images were obtained from the base of the skull through the vertex without contrast.  Comparison: Unenhanced cranial CT 12/16/2011, 09/19/2010.  Findings: Interval right frontal ventriculostomy catheter whose tip is in the frontal horn of the right lateral ventricle.  Interval decrease in size of the ventricles since the examination 2 days ago.  Persistent hemorrhage in the lateral, third, and fourth ventricles.  Diffuse subarachnoid hemorrhage which is similar in distribution but has decreased in amount since the prior examination.  No new areas of hemorrhage.  No midline shift.  Near complete opacification of the ethmoid air cells bilaterally, with air-fluid levels and posterior ethmoid air cells bilaterally.  Mucosal thickening and air fluid level in  the left sphenoid sinus. Opacification of the frontal sinuses bilaterally, left greater than right.  Mucosal thickening involving the maxillary sinuses, with prior left medial antrostomy.  Complete opacification of the left nasal cavity.  IMPRESSION:  1.  Interval decrease in the amount of diffuse subarachnoid hemorrhage since the examination 2 days ago, though a moderate amount of subarachnoid blood persists. 2.  Interval decrease in ventricular size post right frontal ventriculostomy placement. 3.  Persistent hemorrhage in the lateral, third, and fourth ventricles. 4.  No midline shift.  No new intracranial abnormalities. 5.  Acute superimposed upon chronic pansinusitis.  Complete opacification of the left nasal cavity.  Original Report Authenticated By: Arnell Sieving, M.D.   Ct Head Wo Contrast  12/16/2011  *RADIOLOGY REPORT*  Clinical Data: Subarachnoid hemorrhage.  Increasing lethargy.  CT HEAD WITHOUT CONTRAST  Technique:  Contiguous axial images were obtained from the base of the skull through the vertex without contrast.  Comparison: 12/16/2011  Findings: Diffuse subarachnoid hemorrhage again noted, similar to prior study.  Increasing intraventricular blood, now layering in the posterior horns of the lateral ventricles.  Ventricular size has increased since prior study.  No midline shift.  No parenchymal hemorrhage.  Chronic sinusitis changes throughout the paranasal sinuses.  No acute calvarial abnormality.  IMPRESSION: Continued large diffuse subarachnoid hemorrhage.  Increasing intraventricular blood and ventricular size.  Original Report Authenticated By: Cyndie Chime, M.D.    Assessment/Plan: External ventricular drain date 2 post bleed day 3 recovering well with some improvement in her headache I did  start increasing her fluids this morning with altered and her blood pressure added albumin infusion as well as turned at the rate of her normal saline.  LOS: 2 days     Thaddeaus Monica  P 12/18/2011, 10:50 AM

## 2011-12-19 DIAGNOSIS — J45909 Unspecified asthma, uncomplicated: Secondary | ICD-10-CM | POA: Diagnosis present

## 2011-12-19 DIAGNOSIS — J019 Acute sinusitis, unspecified: Secondary | ICD-10-CM

## 2011-12-19 DIAGNOSIS — J329 Chronic sinusitis, unspecified: Secondary | ICD-10-CM | POA: Diagnosis present

## 2011-12-19 DIAGNOSIS — I609 Nontraumatic subarachnoid hemorrhage, unspecified: Principal | ICD-10-CM | POA: Diagnosis present

## 2011-12-19 DIAGNOSIS — K5900 Constipation, unspecified: Secondary | ICD-10-CM | POA: Diagnosis not present

## 2011-12-19 DIAGNOSIS — R519 Headache, unspecified: Secondary | ICD-10-CM | POA: Diagnosis present

## 2011-12-19 DIAGNOSIS — R05 Cough: Secondary | ICD-10-CM | POA: Diagnosis present

## 2011-12-19 DIAGNOSIS — R51 Headache: Secondary | ICD-10-CM | POA: Diagnosis present

## 2011-12-19 LAB — BASIC METABOLIC PANEL
CO2: 23 mEq/L (ref 19–32)
Calcium: 8.7 mg/dL (ref 8.4–10.5)
Creatinine, Ser: 0.52 mg/dL (ref 0.50–1.10)
GFR calc non Af Amer: >90 mL/min (ref >90)
Glucose, Bld: 108 mg/dL — ABNORMAL HIGH (ref 70–99)
Sodium: 136 mEq/L (ref 135–145)

## 2011-12-19 LAB — GLUCOSE, CAPILLARY

## 2011-12-19 MED ORDER — ALBUTEROL SULFATE (5 MG/ML) 0.5% IN NEBU
2.5000 mg | INHALATION_SOLUTION | Freq: Two times a day (BID) | RESPIRATORY_TRACT | Status: DC
  Administered 2011-12-19: 2.5 mg via RESPIRATORY_TRACT
  Filled 2011-12-19 (×2): qty 0.5

## 2011-12-19 MED ORDER — HYDROCODONE-HOMATROPINE 5-1.5 MG/5ML PO SYRP
5.0000 mL | ORAL_SOLUTION | ORAL | Status: DC | PRN
  Administered 2011-12-19 – 2011-12-21 (×2): 5 mL via ORAL
  Filled 2011-12-19 (×2): qty 5

## 2011-12-19 MED ORDER — ALBUTEROL SULFATE (5 MG/ML) 0.5% IN NEBU
2.5000 mg | INHALATION_SOLUTION | Freq: Two times a day (BID) | RESPIRATORY_TRACT | Status: DC
  Administered 2011-12-20 – 2011-12-27 (×15): 2.5 mg via RESPIRATORY_TRACT
  Filled 2011-12-19 (×15): qty 0.5

## 2011-12-19 MED ORDER — PANTOPRAZOLE SODIUM 40 MG PO TBEC
40.0000 mg | DELAYED_RELEASE_TABLET | Freq: Every day | ORAL | Status: DC
  Administered 2011-12-19 – 2012-01-06 (×20): 40 mg via ORAL
  Filled 2011-12-19 (×19): qty 1

## 2011-12-19 MED ORDER — OXYCODONE-ACETAMINOPHEN 5-325 MG PO TABS
1.0000 | ORAL_TABLET | ORAL | Status: DC | PRN
  Administered 2011-12-19 – 2011-12-22 (×7): 2 via ORAL
  Administered 2011-12-22: 1 via ORAL
  Administered 2011-12-23 (×3): 2 via ORAL
  Administered 2011-12-23: 1 via ORAL
  Administered 2011-12-24 – 2012-01-03 (×33): 2 via ORAL
  Administered 2012-01-03: 1 via ORAL
  Administered 2012-01-03 (×2): 2 via ORAL
  Administered 2012-01-04 (×2): 1 via ORAL
  Administered 2012-01-04 (×2): 2 via ORAL
  Administered 2012-01-04: 1 via ORAL
  Administered 2012-01-05: 2 via ORAL
  Administered 2012-01-05: 1 via ORAL
  Administered 2012-01-05: 2 via ORAL
  Administered 2012-01-05: 1 via ORAL
  Administered 2012-01-05 – 2012-01-06 (×3): 2 via ORAL
  Administered 2012-01-06: 1 via ORAL
  Filled 2011-12-19 (×6): qty 2
  Filled 2011-12-19: qty 1
  Filled 2011-12-19 (×3): qty 2
  Filled 2011-12-19 (×3): qty 1
  Filled 2011-12-19 (×4): qty 2
  Filled 2011-12-19: qty 1
  Filled 2011-12-19 (×5): qty 2
  Filled 2011-12-19 (×2): qty 1
  Filled 2011-12-19 (×5): qty 2
  Filled 2011-12-19: qty 1
  Filled 2011-12-19 (×18): qty 2
  Filled 2011-12-19: qty 1
  Filled 2011-12-19 (×3): qty 2
  Filled 2011-12-19: qty 1
  Filled 2011-12-19 (×9): qty 2

## 2011-12-19 MED ORDER — SALINE SPRAY 0.65 % NA SOLN
1.0000 | NASAL | Status: DC | PRN
  Filled 2011-12-19: qty 44

## 2011-12-19 MED ORDER — MONTELUKAST SODIUM 10 MG PO TABS
10.0000 mg | ORAL_TABLET | Freq: Every day | ORAL | Status: DC
  Administered 2011-12-19 – 2011-12-26 (×8): 10 mg via ORAL
  Filled 2011-12-19 (×10): qty 1

## 2011-12-19 MED ORDER — BUDESONIDE 0.5 MG/2ML IN SUSP
0.5000 mg | Freq: Two times a day (BID) | RESPIRATORY_TRACT | Status: DC
  Administered 2011-12-19 – 2011-12-21 (×5): 0.5 mg via RESPIRATORY_TRACT
  Filled 2011-12-19 (×9): qty 2

## 2011-12-19 MED ORDER — ALBUTEROL SULFATE (5 MG/ML) 0.5% IN NEBU
2.5000 mg | INHALATION_SOLUTION | RESPIRATORY_TRACT | Status: DC | PRN
  Administered 2011-12-25: 2.5 mg via RESPIRATORY_TRACT
  Filled 2011-12-19: qty 0.5

## 2011-12-19 MED ORDER — LORATADINE 10 MG PO TABS
10.0000 mg | ORAL_TABLET | Freq: Every day | ORAL | Status: DC
  Administered 2011-12-19 – 2012-01-06 (×19): 10 mg via ORAL
  Filled 2011-12-19 (×20): qty 1

## 2011-12-19 MED ORDER — LEVETIRACETAM 500 MG PO TABS
500.0000 mg | ORAL_TABLET | Freq: Two times a day (BID) | ORAL | Status: DC
  Administered 2011-12-19 – 2011-12-28 (×18): 500 mg via ORAL
  Filled 2011-12-19 (×21): qty 1

## 2011-12-19 MED ORDER — ALBUTEROL SULFATE (5 MG/ML) 0.5% IN NEBU: 2.5000 mg | INHALATION_SOLUTION | RESPIRATORY_TRACT | Status: DC | PRN

## 2011-12-19 NOTE — Progress Notes (Signed)
12/19/11 : just seen by neurosurgery- didn't disturb patient further.  CT of 12/18/11 reveals overall decrease in [facility] and ventric size post IVC.  Plan is for repeat angio Monday 12/26/11 with Dr. Corliss Skains. Will follow along clinically with neuro.

## 2011-12-19 NOTE — Progress Notes (Signed)
Subjective: Patient resting in bed, with eyes covered because of photophobia. Continued to have difficulties with headache and neck pain. Continues on IV F. and albumin IV.  Objective: Vital signs in last 24 hours: Filed Vitals:   12/19/11 1000 12/19/11 1100 12/19/11 1200 12/19/11 1300  BP: 120/60 124/72 126/70 124/69  Pulse: 62 66 69 62  Temp:  97.7 F (36.5 C)    TempSrc:  Axillary    Resp: 11 14 16 11   Height:      Weight:      SpO2: 91% 96% 97% 95%    Intake/Output from previous day: 05/05 0701 - 05/06 0700 In: 3960 [I.V.:3000; IV Piggyback:960] Out: 3731 [Urine:3475; Drains:256] Intake/Output this shift: Total I/O In: 950 [I.V.:750; IV Piggyback:200] Out: 931 [Urine:855; Drains:76]  Physical Exam:  Awake and alert, oriented to name, [facility] hospital, and May 2013. Following commands. Speech is fluent. 3+ meningismus. No drift of upper extremities. Has 5 over 5 strength throughout.  CBC  Basename 12/18/11 0345  WBC 5.1  HGB 11.8*  HCT 34.6*  PLT 186   BMET  Basename 12/19/11 0358 12/18/11 0345  NA 136 132*  K 3.4* 3.3*  CL 104 99  CO2 23 27  GLUCOSE 108* 110*  BUN 7 10  CREATININE 0.52 0.61  CALCIUM 8.7 8.3*    Studies/Results: Ct Head Wo Contrast  12/18/2011  *RADIOLOGY REPORT*  Clinical Data: Follow up subarachnoid hemorrhage.  Worsening headache.  CT HEAD WITHOUT CONTRAST 12/18/2011:  Technique:  Contiguous axial images were obtained from the base of the skull through the vertex without contrast.  Comparison: Unenhanced cranial CT 12/16/2011, 09/19/2010.  Findings: Interval right frontal ventriculostomy catheter whose tip is in the frontal horn of the right lateral ventricle.  Interval decrease in size of the ventricles since the examination 2 days ago.  Persistent hemorrhage in the lateral, third, and fourth ventricles.  Diffuse subarachnoid hemorrhage which is similar in distribution but has decreased in amount since the prior examination.  No new areas  of hemorrhage.  No midline shift.  Near complete opacification of the ethmoid air cells bilaterally, with air-fluid levels and posterior ethmoid air cells bilaterally.  Mucosal thickening and air fluid level in the left sphenoid sinus. Opacification of the frontal sinuses bilaterally, left greater than right.  Mucosal thickening involving the maxillary sinuses, with prior left medial antrostomy.  Complete opacification of the left nasal cavity.  IMPRESSION:  1.  Interval decrease in the amount of diffuse subarachnoid hemorrhage since the examination 2 days ago, though a moderate amount of subarachnoid blood persists. 2.  Interval decrease in ventricular size post right frontal ventriculostomy placement. 3.  Persistent hemorrhage in the lateral, third, and fourth ventricles. 4.  No midline shift.  No new intracranial abnormalities. 5.  Acute superimposed upon chronic pansinusitis.  Complete opacification of the left nasal cavity.  Original Report Authenticated By: Arnell Sieving, M.D.    Assessment/Plan: Have been asked by Dr. Venetia Maxon to take over the care of this patient. Patient to continue on IV fluids, Nimotop, IVC drainage, and rest. CT yesterday shows decreased ventricular size following placement of right frontal IVC, as well as decreased overall subarachnoid hemorrhage. Anticipate continuing IVC drainage through this week, and repeating cerebral angiogram next week to rule out aneurysm. Have discussed my assessment and recommendations with the patient, and we'll discuss with her husband when he is available.   Hewitt Shorts, MD 12/19/2011, 2:02 PM

## 2011-12-19 NOTE — Progress Notes (Signed)
OT Cancellation Note  Treatment cancelled today due to:  Ot awaiting MD Venetia Maxon increase activity and clamp drain per [employer] note. OT to hold evaluation at this time. OT will sign off on 12/20/11 if pt remains on bedrest. OT x4 days following patient with pt on bedrest.    Lucile Shutters   OTR/L Pager: [phone] Office: [phone] .

## 2011-12-19 NOTE — Progress Notes (Signed)
Danielle Riddle is a 66 y.o. female admitted on 12/16/2011 with headache, n/v, photophobia, and confusion.  Found to have SAH.  Had tx for asthma exacerbation, and sinusitis prior to admit (Followed by Dr. Shelle Iron as outpt).  PCCM consulted 5/3 for medical management while in ICU.  Line/tube: Rt ventricular drain 5/3>>  Cultures:  Antibiotics: Zithromax 5/1>>5/4  Tests/events: 5/3 CT head>>SAH, no hydrocephalus 5/5 CT head>>decrease in SAH, no change in IVH, acute on chronic pansinusitis  SUBJECTIVE: C/O terrible headache made worse by productive cough and sinus congestion.  Breathing okay.  OBJECTIVE:  Blood pressure 118/62, pulse 66, temperature 98.6 F (37 C), temperature source Oral, resp. rate 10, height 5\' 6"  (1.676 m), weight 149 lb 14.6 oz (68 kg), SpO2 92.00%. Wt Readings from Last 3 Encounters:  12/19/11 149 lb 14.6 oz (68 kg)  12/14/11 143 lb 3.2 oz (64.955 kg)  09/20/11 143 lb 6.4 oz (65.046 kg)   Body mass index is 24.20 kg/(m^2).  I/O last 3 completed shifts: In: 6110 [I.V.:4150; IV Piggyback:1960] Out: 5111 [Urine:4775; Drains:336]  Physical Exam: General - covering eyes to block light HEENT - clear nasal drainage Cardiac - s1s2 regular Chest - scattered rhonchi, no wheeze Abd - soft, nontender Ext - no edema Neuro - normal strength Psych - normal mood, behavior  CBC    Component Value Date/Time   WBC 5.1 12/18/2011 0345   RBC 3.75* 12/18/2011 0345   HGB 11.8* 12/18/2011 0345   HCT 34.6* 12/18/2011 0345   PLT 186 12/18/2011 0345   MCV 92.3 12/18/2011 0345   MCH 31.5 12/18/2011 0345   MCHC 34.1 12/18/2011 0345   RDW 12.9 12/18/2011 0345   LYMPHSABS 1.0 12/16/2011 0259   MONOABS 0.8 12/16/2011 0259   EOSABS 0.0 12/16/2011 0259   BASOSABS 0.0 12/16/2011 0259    BMET    Component Value Date/Time   NA 136 12/19/2011 0358   K 3.4* 12/19/2011 0358   CL 104 12/19/2011 0358   CO2 23 12/19/2011 0358   GLUCOSE 108* 12/19/2011 0358   BUN 7 12/19/2011 0358   CREATININE 0.52 12/19/2011  0358   CALCIUM 8.7 12/19/2011 0358   GFRNONAA >90 12/19/2011 0358   GFRAA >90 12/19/2011 0358    Lab Results  Component Value Date   ALT 15 12/16/2011   AST 29 12/16/2011   ALKPHOS 56 12/16/2011   BILITOT 0.1* 12/16/2011    Ct Head Wo Contrast  12/18/2011  *RADIOLOGY REPORT*  Clinical Data: Follow up subarachnoid hemorrhage.  Worsening headache.  CT HEAD WITHOUT CONTRAST 12/18/2011:  Technique:  Contiguous axial images were obtained from the base of the skull through the vertex without contrast.  Comparison: Unenhanced cranial CT 12/16/2011, 09/19/2010.  Findings: Interval right frontal ventriculostomy catheter whose tip is in the frontal horn of the right lateral ventricle.  Interval decrease in size of the ventricles since the examination 2 days ago.  Persistent hemorrhage in the lateral, third, and fourth ventricles.  Diffuse subarachnoid hemorrhage which is similar in distribution but has decreased in amount since the prior examination.  No new areas of hemorrhage.  No midline shift.  Near complete opacification of the ethmoid air cells bilaterally, with air-fluid levels and posterior ethmoid air cells bilaterally.  Mucosal thickening and air fluid level in the left sphenoid sinus. Opacification of the frontal sinuses bilaterally, left greater than right.  Mucosal thickening involving the maxillary sinuses, with prior left medial antrostomy.  Complete opacification of the left nasal cavity.  IMPRESSION:  1.  Interval decrease in the amount of diffuse subarachnoid hemorrhage since the examination 2 days ago, though a moderate amount of subarachnoid blood persists. 2.  Interval decrease in ventricular size post right frontal ventriculostomy placement. 3.  Persistent hemorrhage in the lateral, third, and fourth ventricles. 4.  No midline shift.  No new intracranial abnormalities. 5.  Acute superimposed upon chronic pansinusitis.  Complete opacification of the left nasal cavity.  Original Report Authenticated By:  Arnell Sieving, M.D.    ASSESSMENT/PLAN:  [facility] s/p Ventricular drain.  Intermittently has severe headaches. -Drain per neurosurgery -goal SBP 120 to 140>>IV fluid -continue nimodipine and monitor for vasospasm>>f/u TCD intermittently -keppra for anti-sz prophylaxis per neurosurgery -rocephin for post-drain prophylaxis -control headache>>prn tylenol, percocet, morphine  Acute on chronic sinusitis -continue flonase -add singulair, loratidine -saline irrigation as needed -rocephin should help for this also  Asthma with persistent cough -change flovent to pulmicort via nebulizer for now -change albuterol to bid and q2h prn -add hycodan for cough suppression  Constipation likely from narcotic use -continue bowel regimen  Best practice -SCD for DVT prophylaxis -Protonix for SUP -HOB elevated -clear liquid diet  Updated husband at bedside about plan    Leelynd Maldonado Pager:  [phone] 12/19/2011, 9:34 AM

## 2011-12-19 NOTE — Progress Notes (Signed)
TCD completed.  Today's findings recorded with results from 12-16-11.

## 2011-12-19 NOTE — Progress Notes (Signed)
UR COMPLETED  

## 2011-12-19 NOTE — Progress Notes (Signed)
Subjective: Patient reports "My headache is worse this morning"  Objective: Vital signs in last 24 hours: Temp:  [98.3 F (36.8 C)-98.6 F (37 C)] 98.6 F (37 C) (05/05 2000) Pulse Rate:  [60-71] 66  (05/06 0900) Resp:  [10-19] 10  (05/06 0900) BP: (96-132)/(51-83) 118/62 mmHg (05/06 0900) SpO2:  [92 %-96 %] 92 % (05/06 0900) Weight:  [68 kg (149 lb 14.6 oz)] 68 kg (149 lb 14.6 oz) (05/06 0627)  Intake/Output from previous day: 05/05 0701 - 05/06 0700 In: 3960 [I.V.:3000; IV Piggyback:960] Out: 3731 [Urine:3475; Drains:256] Intake/Output this shift: Total I/O In: 330 [I.V.:250; IV Piggyback:80] Out: 206 [Urine:175; Drains:31]  Alert, conversant. MAEW, PEARL. Reporting h/s increase this am. Drain 31cc this am, bloody. Husband present.   Lab Results:  [facility] 12/18/11 0345  WBC 5.1  HGB 11.8*  HCT 34.6*  PLT 186   BMET  Basename 12/19/11 0358 12/18/11 0345  NA 136 132*  K 3.4* 3.3*  CL 104 99  CO2 23 27  GLUCOSE 108* 110*  BUN 7 10  CREATININE 0.52 0.61  CALCIUM 8.7 8.3*    Studies/Results: Ct Head Wo Contrast  12/18/2011  *RADIOLOGY REPORT*  Clinical Data: Follow up subarachnoid hemorrhage.  Worsening headache.  CT HEAD WITHOUT CONTRAST 12/18/2011:  Technique:  Contiguous axial images were obtained from the base of the skull through the vertex without contrast.  Comparison: Unenhanced cranial CT 12/16/2011, 09/19/2010.  Findings: Interval right frontal ventriculostomy catheter whose tip is in the frontal horn of the right lateral ventricle.  Interval decrease in size of the ventricles since the examination 2 days ago.  Persistent hemorrhage in the lateral, third, and fourth ventricles.  Diffuse subarachnoid hemorrhage which is similar in distribution but has decreased in amount since the prior examination.  No new areas of hemorrhage.  No midline shift.  Near complete opacification of the ethmoid air cells bilaterally, with air-fluid levels and posterior ethmoid air  cells bilaterally.  Mucosal thickening and air fluid level in the left sphenoid sinus. Opacification of the frontal sinuses bilaterally, left greater than right.  Mucosal thickening involving the maxillary sinuses, with prior left medial antrostomy.  Complete opacification of the left nasal cavity.  IMPRESSION:  1.  Interval decrease in the amount of diffuse subarachnoid hemorrhage since the examination 2 days ago, though a moderate amount of subarachnoid blood persists. 2.  Interval decrease in ventricular size post right frontal ventriculostomy placement. 3.  Persistent hemorrhage in the lateral, third, and fourth ventricles. 4.  No midline shift.  No new intracranial abnormalities. 5.  Acute superimposed upon chronic pansinusitis.  Complete opacification of the left nasal cavity.  Original Report Authenticated By: Arnell Sieving, M.D.    Assessment/Plan: Stable.  LOS: 3 days  Continue support. Will d/w Dr. Venetia Maxon PT continue with drain clamped vs. hold.    Georgiann Cocker 12/19/2011, 9:42 AM     IVC draining well.  Patient still c/o HA.  Neuro stable.

## 2011-12-19 NOTE — Progress Notes (Signed)
  Johnston, Blaire Palomino Brynn   OTR/L Pager: [phone] Office: [phone] .  

## 2011-12-19 NOTE — Progress Notes (Signed)
Speech Language Pathology Treatment Patient Details Name: Danielle Riddle MRN: [id number] DOB: 09-12-1945 Today's Date: 12/19/2011 Time: 1138-1202 SLP Time Calculation (min): 24 min  Assessment / Plan / Recommendation Clinical Impression  Treatment focused on diagnostic-treatment of cognitive functioning.  Today patient was oriented x4, able to sustain and select her attention throughout treatment despte 5/10 headache.  Patient accurately recalled events of the weekend and today, however reports not remembering last evening stating, "Sunday night disappeared and that was so scary to realize I lost a night."  Verified with RN that she received a new pain medication that likely was the cause of disorientation. WIll update goals and f/u on 5/8.    SLP Plan  Continue with current plan of care;Goals updated    Pertinent Vitals/Pain n/a  SLP Goals  SLP Goals Progress/Goals/Alternative treatment plan discussed with pt/caregiver and they: Agree SLP Goal #1 - Progress: Met SLP Goal #2 - Progress: Met SLP Goal #3 - Progress: Met  Updated Goal #1:  Patient will demonstrate anticipatory awareness of new medical/physical/cognitive changes for D/C home with modified independence. Updated Goal #2:  Patient will solve complex problems related to her care as a modified independent. Updated Goal #3:  Patient will communicate and direct her care for pain management as a modified independent.   Treatment Treatment focused on: Cognition   Myra Rude, M.S.,CCC-SLP Pager [phone] 12/19/2011, 12:08 PM

## 2011-12-19 NOTE — Progress Notes (Signed)
Patient is being discharged from PT/ OT services secondary to:   Medical decline- will need to re-order to resume therapy services. Patient had IVC placed after PT/OT ordered and continues on bedrest. Noted Dr. Earl Gala note stating drain will stay in place this week with plans to re: scan early next week.   Progress and discharge plan and discussed with patient/caregiver and they:  No caregivers available Patient unable to participate in discharge planning (sleeping and did not awaken)  Please re-order therapies when appropriate.   12/19/2011 Veda Canning, PT Pager: [phone]

## 2011-12-20 ENCOUNTER — Inpatient Hospital Stay (HOSPITAL_COMMUNITY): Payer: PRIVATE HEALTH INSURANCE

## 2011-12-20 LAB — BASIC METABOLIC PANEL
BUN: 6 mg/dL (ref 6–23)
Calcium: 9.1 mg/dL (ref 8.4–10.5)
GFR calc Af Amer: >90 mL/min (ref >90)
GFR calc non Af Amer: >90 mL/min (ref >90)
Glucose, Bld: 131 mg/dL — ABNORMAL HIGH (ref 70–99)
Potassium: 3.6 mEq/L (ref 3.5–5.1)

## 2011-12-20 LAB — GLUCOSE, CAPILLARY: Glucose-Capillary: 136 mg/dL — ABNORMAL HIGH (ref 70–99)

## 2011-12-20 NOTE — Progress Notes (Signed)
Subjective: Patient's continue to have difficulties with headaches and neck pain. Given morphine IV about an hour and 20 minutes ago, and essentially had more confusion per her nurse, Herbert Seta. IVC continued to drain well.  Objective: Vital signs in last 24 hours: Filed Vitals:   12/20/11 0600 12/20/11 0700 12/20/11 0734 12/20/11 0800  BP: 130/62 130/95  141/63  Pulse: 61 71  65  Temp:   99.6 F (37.6 C)   TempSrc:   Axillary   Resp: 16 18    Height:      Weight:      SpO2: 96% 95% 100% 95%    Intake/Output from previous day: 05/06 0701 - 05/07 0700 In: 3984.6 [I.V.:2914.6; IV Piggyback:1070] Out: 4470 [Urine:4250; Drains:220] Intake/Output this shift: Total I/O In: 165 [I.V.:125; IV Piggyback:40] Out: 130 [Urine:125; Drains:5]  Physical Exam:  Drowsy but arousable. Oriented to her name, and with some cueing [place], 2013, and again with some cueing to May. Following commands. 3+ meningismus. Pupils 2 mm, round and sluggishly reactive to light. No drift of upper extremities. Fire 5 strength.  CBC  Basename 12/18/11 0345  WBC 5.1  HGB 11.8*  HCT 34.6*  PLT 186   BMET  Basename 12/20/11 0303 12/19/11 0358  NA 136 136  K 3.6 3.4*  CL 101 104  CO2 28 23  GLUCOSE 131* 108*  BUN 6 7  CREATININE 0.60 0.52  CALCIUM 9.1 8.7    Assessment/Plan: Spoke with patient and her husband at length today at the bedside. Discussed the history of events last week that led up to her subarachnoid hemorrhage. Discussed her plans for current treatment and evaluation. Their questions were answered. We'll continue normal saline infusion, but discontinue albumin infusion.   Hewitt Shorts, MD 12/20/2011, 8:46 AM

## 2011-12-20 NOTE — Progress Notes (Signed)
Subjective: Called by nursing staff during the afternoon because of increased confusion. Obtained followup CT scan of the brain without contrast which showed increased ventricular size. Ordered the IVC to be lowered to the 0 cm of water level. IVC drainage has increased since the drip chamber was lowered. Followup CT the brain has been ordered for the morning.  Objective: Vital signs in last 24 hours: Filed Vitals:   12/20/11 1800 12/20/11 1900 12/20/11 1933 12/20/11 2000  BP: 135/73 114/70  110/80  Pulse: 62 66  73  Temp:      TempSrc:      Resp: 16 14  14   Height:      Weight:      SpO2: 96% 96% 99% 95%    Intake/Output from previous day: 05/06 0701 - 05/07 0700 In: 3984.6 [I.V.:2914.6; IV Piggyback:1070] Out: 4470 [Urine:4250; Drains:220] Intake/Output this shift: Total I/O In: 125 [I.V.:125] Out: 223 [Urine:200; Drains:23]  Physical Exam:  Patient is more awake and alert now, than this morning. She is oriented to name, [place], and May 2013. She is following commands and has no drift of the upper extremities.  CBC  Basename 12/18/11 0345  WBC 5.1  HGB 11.8*  HCT 34.6*  PLT 186   BMET  Basename 12/20/11 0303 12/19/11 0358  NA 136 136  K 3.6 3.4*  CL 101 104  CO2 28 23  GLUCOSE 131* 108*  BUN 6 7  CREATININE 0.60 0.52  CALCIUM 9.1 8.7    Studies/Results: Ct Head Wo Contrast  12/20/2011  *RADIOLOGY REPORT*  Clinical Data: Worsening neurologic status.  Recent subarachnoid hemorrhage.  CT HEAD WITHOUT CONTRAST  Technique:  Contiguous axial images were obtained from the base of the skull through the vertex without contrast.  Comparison: Multiple prior CT scans, most recent 12/18/2011.  Findings: Despite the satisfactory location of the right frontal ventricular drainage catheter, there is increasing hydrocephalus when compared with the examination 2 days ago.  Bifrontal diameter at the level of the foramen of Monro is 41 mm as compared with 32 mm on 12/18/2011.   Temporal horns are dilated.  There is improving subarachnoid blood, but persistent intraventricular blood.  No new subarachnoid blood is seen. There is no visible infarction or parenchymal hemorrhage.  No midline shift. No extra-axial fluid.  IMPRESSION: Worsening hydrocephalus despite good position of intraventricular drainage catheter.  Dr. Newell Coral aware.  No new subarachnoid blood.  Original Report Authenticated By: Elsie Stain, M.D.    Assessment/Plan: We'll continue to monitor closely. For CT brain without in a.m. For TCD tomorrow.   Hewitt Shorts, MD 12/20/2011, 8:52 PM

## 2011-12-20 NOTE — Progress Notes (Signed)
SLP Note  SLP orders discontinued.  Please re-order if any further concerns arise for this patient's cognition.  Myra Rude, M.S.,CCC-SLP Pager [phone] 12/20/2011, 10:20 AM

## 2011-12-20 NOTE — Progress Notes (Signed)
Danielle Riddle is a 66 y.o. female admitted on 12/16/2011 with headache, n/v, photophobia, and confusion.  Found to have SAH.  Had tx for asthma exacerbation, and sinusitis prior to admit (Followed by Dr. Shelle Iron as outpt).  PCCM consulted 5/3 for medical management while in ICU.  Line/tube: Rt ventricular drain 5/3>>  Cultures: MRSA screen 5/3>>negative  Antibiotics: Zithromax 5/1>>5/4 Rocephin 5/4>>  Tests/events: 5/3 CT head>>SAH, no hydrocephalus 5/5 CT head>>decrease in SAH, no change in IVH, acute on chronic pansinusitis  SUBJECTIVE: Headache, photophobia improved.  Still has cough, but decreased.  Sinus congestion improved.  OBJECTIVE:  Blood pressure 141/63, pulse 65, temperature 99.6 F (37.6 C), temperature source Axillary, resp. rate 18, height 5\' 6"  (1.676 m), weight 151 lb 7.3 oz (68.7 kg), SpO2 95.00%. Wt Readings from Last 3 Encounters:  12/20/11 151 lb 7.3 oz (68.7 kg)  12/14/11 143 lb 3.2 oz (64.955 kg)  09/20/11 143 lb 6.4 oz (65.046 kg)   Body mass index is 24.45 kg/(m^2).  I/O last 3 completed shifts: In: 5964.6 [I.V.:4414.6; IV Piggyback:1550] Out: 6381 [Urine:6050; Drains:331]  Physical Exam: General - no distress HEENT - clear nasal drainage Cardiac - s1s2 regular Chest - better air movement, no wheeze, decreased rhonchi Abd - soft, nontender Ext - no edema Neuro - sleepy, normal strength Psych - normal mood, behavior  CBC    Component Value Date/Time   WBC 5.1 12/18/2011 0345   RBC 3.75* 12/18/2011 0345   HGB 11.8* 12/18/2011 0345   HCT 34.6* 12/18/2011 0345   PLT 186 12/18/2011 0345   MCV 92.3 12/18/2011 0345   MCH 31.5 12/18/2011 0345   MCHC 34.1 12/18/2011 0345   RDW 12.9 12/18/2011 0345   LYMPHSABS 1.0 12/16/2011 0259   MONOABS 0.8 12/16/2011 0259   EOSABS 0.0 12/16/2011 0259   BASOSABS 0.0 12/16/2011 0259    BMET    Component Value Date/Time   NA 136 12/20/2011 0303   K 3.6 12/20/2011 0303   CL 101 12/20/2011 0303   CO2 28 12/20/2011 0303   GLUCOSE 131*  12/20/2011 0303   BUN 6 12/20/2011 0303   CREATININE 0.60 12/20/2011 0303   CALCIUM 9.1 12/20/2011 0303   GFRNONAA >90 12/20/2011 0303   GFRAA >90 12/20/2011 0303     ASSESSMENT/PLAN:  [facility]H s/p Ventricular drain.  Intermittently has severe headaches>>improved 5/7. -Drain per neurosurgery -goal SBP 120 to 140>>continue IV fluid -continue nimodipine and monitor for vasospasm>>f/u TCD intermittently -keppra for anti-sz prophylaxis per neurosurgery -rocephin for post-drain prophylaxis -control headache>>prn tylenol, percocet, morphine  Acute on chronic sinusitis>>improving 5/7 -continue flonase, singulair, loratidine -saline irrigation as needed -rocephin should help for this also  Asthma with persistent cough -changed flovent to pulmicort via nebulizer for now -changed albuterol to bid and q2h prn -added hycodan for cough suppression  Constipation likely from narcotic use -continue bowel regimen  Best practice -SCD for DVT prophylaxis -Protonix for SUP -HOB elevated -clear liquid diet  Updated husband at bedside about plan    [place]orn[place] Record Pager:  [phone] 12/20/2011, 8:42 AM

## 2011-12-20 NOTE — Progress Notes (Signed)
  Johnston, Liya Strollo Brynn   OTR/L Pager: [phone] Office: [phone] .  

## 2011-12-21 ENCOUNTER — Encounter (HOSPITAL_COMMUNITY): Payer: Self-pay | Admitting: Radiology

## 2011-12-21 ENCOUNTER — Inpatient Hospital Stay (HOSPITAL_COMMUNITY): Payer: PRIVATE HEALTH INSURANCE

## 2011-12-21 DIAGNOSIS — I609 Nontraumatic subarachnoid hemorrhage, unspecified: Secondary | ICD-10-CM

## 2011-12-21 DIAGNOSIS — R05 Cough: Secondary | ICD-10-CM

## 2011-12-21 DIAGNOSIS — J45909 Unspecified asthma, uncomplicated: Secondary | ICD-10-CM

## 2011-12-21 LAB — BASIC METABOLIC PANEL
BUN: 9 mg/dL (ref 6–23)
CO2: 26 mEq/L (ref 19–32)
Chloride: 101 mEq/L (ref 96–112)
Glucose, Bld: 131 mg/dL — ABNORMAL HIGH (ref 70–99)
Potassium: 3.4 mEq/L — ABNORMAL LOW (ref 3.5–5.1)
Sodium: 134 mEq/L — ABNORMAL LOW (ref 135–145)

## 2011-12-21 LAB — CBC
HCT: 34.1 % — ABNORMAL LOW (ref 36.0–46.0)
Hemoglobin: 12 g/dL (ref 12.0–15.0)
MCH: 31.7 pg (ref 26.0–34.0)
MCHC: 35.2 g/dL (ref 30.0–36.0)
MCV: 90.2 fL (ref 78.0–100.0)
RBC: 3.78 MIL/uL — ABNORMAL LOW (ref 3.87–5.11)

## 2011-12-21 MED ORDER — SALINE SPRAY 0.65 % NA SOLN
2.0000 | Freq: Two times a day (BID) | NASAL | Status: DC
  Administered 2011-12-21 – 2012-01-06 (×23): 2 via NASAL
  Filled 2011-12-21: qty 44

## 2011-12-21 MED ORDER — HYDROCODONE-HOMATROPINE 5-1.5 MG/5ML PO SYRP
5.0000 mL | ORAL_SOLUTION | Freq: Two times a day (BID) | ORAL | Status: DC
  Administered 2011-12-21 – 2011-12-29 (×15): 5 mL via ORAL
  Filled 2011-12-21 (×12): qty 5

## 2011-12-21 MED ORDER — POTASSIUM CHLORIDE IN NACL 40-0.9 MEQ/L-% IV SOLN
INTRAVENOUS | Status: DC
  Administered 2011-12-21 – 2011-12-26 (×11): via INTRAVENOUS
  Filled 2011-12-21 (×17): qty 1000

## 2011-12-21 MED ORDER — HYDROCODONE-HOMATROPINE 5-1.5 MG/5ML PO SYRP
5.0000 mL | ORAL_SOLUTION | ORAL | Status: DC | PRN
  Administered 2011-12-21 – 2012-01-05 (×4): 5 mL via ORAL
  Filled 2011-12-21 (×8): qty 5

## 2011-12-21 MED ORDER — FLUTICASONE PROPIONATE 50 MCG/ACT NA SUSP
2.0000 | Freq: Two times a day (BID) | NASAL | Status: DC
  Administered 2011-12-21 – 2012-01-06 (×32): 2 via NASAL
  Filled 2011-12-21: qty 16

## 2011-12-21 NOTE — Progress Notes (Signed)
Danielle Riddle is a 66 y.o. female admitted on 12/16/2011 with headache, n/v, photophobia, and confusion.  Found to have SAH.  Had tx for asthma exacerbation, and sinusitis prior to admit (Followed by Dr. Shelle Riddle as outpt).  PCCM consulted 5/3 for medical management while in ICU.  Line/tube: Rt ventricular drain 5/3>>  Cultures: MRSA screen 5/3>>negative  Antibiotics: Zithromax 5/1>>5/4 Rocephin 5/4>>  Tests/events: 5/3 CT head>>SAH, no hydrocephalus 5/5 CT head>>decrease in SAH, no change in IVH, acute on chronic pansinusitis  SUBJECTIVE: Main complaint is cough related headache.  Cough medicine does not help much.  She feels she has sinus congestion with post-nasal drip that triggers cough.  OBJECTIVE:  Blood pressure 133/58, pulse 69, temperature 98.7 F (37.1 C), temperature source Oral, resp. rate 14, height 5\' 6"  (1.676 m), weight 143 lb 15.4 oz (65.3 kg), SpO2 96.00%. Wt Readings from Last 3 Encounters:  12/21/11 143 lb 15.4 oz (65.3 kg)  12/14/11 143 lb 3.2 oz (64.955 kg)  09/20/11 143 lb 6.4 oz (65.046 kg)   Body mass index is 23.24 kg/(m^2).  I/O last 3 completed shifts: In: 5082.5 [I.V.:4512.5; IV Piggyback:570] Out: 6120 [Urine:5660; Drains:460]  Physical Exam: General - no distress HEENT - clear nasal drainage Cardiac - s1s2 regular Chest - good air entry, no wheeze/rales Abd - soft, nontender Ext - no edema Neuro - sleepy, normal strength Psych - normal mood, behavior  CBC    Component Value Date/Time   WBC 7.5 12/21/2011 0409   RBC 3.78* 12/21/2011 0409   HGB 12.0 12/21/2011 0409   HCT 34.1* 12/21/2011 0409   PLT 245 12/21/2011 0409   MCV 90.2 12/21/2011 0409   MCH 31.7 12/21/2011 0409   MCHC 35.2 12/21/2011 0409   RDW 12.6 12/21/2011 0409   LYMPHSABS 1.0 12/16/2011 0259   MONOABS 0.8 12/16/2011 0259   EOSABS 0.0 12/16/2011 0259   BASOSABS 0.0 12/16/2011 0259    BMET    Component Value Date/Time   NA 134* 12/21/2011 0409   K 3.4* 12/21/2011 0409   CL 101 12/21/2011  0409   CO2 26 12/21/2011 0409   GLUCOSE 131* 12/21/2011 0409   BUN 9 12/21/2011 0409   CREATININE 0.55 12/21/2011 0409   CALCIUM 8.6 12/21/2011 0409   GFRNONAA >90 12/21/2011 0409   GFRAA >90 12/21/2011 0409     ASSESSMENT/PLAN:  [facility] s/p Ventricular drain.  Intermittently has severe headaches>>improved 5/7. -Drain per neurosurgery -goal SBP 120 to 140>>continue IV fluid -continue nimodipine and monitor for vasospasm>>f/u TCD intermittently -keppra for anti-sz prophylaxis per neurosurgery -rocephin for post-drain prophylaxis -control headache>>prn tylenol, percocet, morphine  Acute on chronic sinusitis -change flonase to bid -continue singulair, loratidine -saline irrigation bid and prn -rocephin should help for this also -may need to give short course of prednisone is no improvement  Asthma with persistent cough -changed flovent to pulmicort via nebulizer for now -changed albuterol to bid and q2h prn  Cough likely from post-nasal drip -continue prn hycodan for cough suppression  Constipation likely from narcotic use -continue bowel regimen  Best practice -SCD for DVT prophylaxis -Protonix for SUP -HOB elevated -regular diet  Updated husband at bedside about plan    Danielle Riddle Pager:  [phone] 12/21/2011, 9:12 AM

## 2011-12-21 NOTE — Progress Notes (Signed)
TCD completed and reported with findings from 12-16-11.

## 2011-12-21 NOTE — Progress Notes (Signed)
Subjective:  Patient resting in bed, continues to complain of both headache and neck pain. Continues to have photophobia. IVC draining serosanguineous CSF.  Objective: Vital signs in last 24 hours: Filed Vitals:   12/21/11 0400 12/21/11 0500 12/21/11 0600 12/21/11 0700  BP: 117/59 119/61 128/65 127/65  Pulse: 82 70 67 66  Temp:    98.7 F (37.1 C)  TempSrc:    Oral  Resp: 15 13 13 13   Height:      Weight:  65.3 kg (143 lb 15.4 oz)    SpO2: 96% 94% 95% 96%    Intake/Output from previous day: 05/07 0701 - 05/08 0700 In: 3102.5 [I.V.:3012.5; IV Piggyback:90] Out: 3783 [Urine:3410; Drains:373] Intake/Output this shift:    Physical Exam:  Awake and alert, oriented. Following commands. Pupils 2.5 mm bilaterally round, reactive to light. Extraocular movements intact. Facial movement symmetrical. 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 No drift of upper extremities. Moving all 4 extremities well.  CBC  Basename 12/21/11 0409  WBC 7.5  HGB 12.0  HCT 34.1*  PLT 245   BMET  Basename 12/21/11 0409 12/20/11 0303  NA 134* 136  K 3.4* 3.6  CL 101 101  CO2 26 28  GLUCOSE 131* 131*  BUN 9 6  CREATININE 0.55 0.60  CALCIUM 8.6 9.1    Studies/Results: Ct Head Wo Contrast  12/21/2011  *RADIOLOGY REPORT*  Clinical Data: Follow-up hydrocephalus.  Recent subarachnoid hemorrhage.  CT HEAD WITHOUT CONTRAST  Technique:  Contiguous axial images were obtained from the base of the skull through the vertex without contrast.  Comparison: 12/20/2011 most recent  Findings: Interval decrease in ventricular size following adjustment of the ventricular drainage to lower the pressure. Biventricular diameter on image 14 is 37 mm, as compared with 42 mm on 5/7.  Temporal horns are also less distended.  Persistent intraventricular blood.  Continued improvement subarachnoid blood.  No extra-axial fluid.  Ventricular catheter good position.  No areas suspicious for acute infarction or parenchymal hemorrhage. No mass lesion  observed.  Moderate sinus disease with air-fluid levels in the sphenoid and maxillary region and near complete filling of the frontal and ethmoid sinuses.  IMPRESSION: Improved hydrocephalus as described.  Increasing bilateral sinus opacities.  Original Report Authenticated By: Elsie Stain, M.D.   Ct Head Wo Contrast  12/20/2011  *RADIOLOGY REPORT*  Clinical Data: Worsening neurologic status.  Recent subarachnoid hemorrhage.  CT HEAD WITHOUT CONTRAST  Technique:  Contiguous axial images were obtained from the base of the skull through the vertex without contrast.  Comparison: Multiple prior CT scans, most recent 12/18/2011.  Findings: Despite the satisfactory location of the right frontal ventricular drainage catheter, there is increasing hydrocephalus when compared with the examination 2 days ago.  Bifrontal diameter at the level of the foramen of Monro is 41 mm as compared with 32 mm on 12/18/2011.  Temporal horns are dilated.  There is improving subarachnoid blood, but persistent intraventricular blood.  No new subarachnoid blood is seen. There is no visible infarction or parenchymal hemorrhage.  No midline shift. No extra-axial fluid.  IMPRESSION: Worsening hydrocephalus despite good position of intraventricular drainage catheter.  Dr. Newell Coral aware.  No new subarachnoid blood.  Original Report Authenticated By: Elsie Stain, M.D.    Assessment/Plan: CT this morning shows significant decrease in ventricular size, we will continue to have the IVC at 0 cm of water. For TCDs today.   Hewitt Shorts, MD 12/21/2011, 8:05 AM

## 2011-12-21 NOTE — Progress Notes (Addendum)
Subjective: SAH 5/3 Cerebral arteriogram without intervention 5/3 ventric 5/4 Still has h/a Confusion yesterday- CT showed worsening hydrocephalus New CT today is better For TCDs today   Objective: Vital signs in last 24 hours: Temp:  [98.6 F (37 C)-100.1 F (37.8 C)] 98.7 F (37.1 C) (05/08 0700) Pulse Rate:  [61-82] 69  (05/08 0800) Resp:  [13-16] 14  (05/08 0800) BP: (110-140)/(57-80) 133/58 mmHg (05/08 0800) SpO2:  [91 %-99 %] 96 % (05/08 0800) Weight:  [143 lb 15.4 oz (65.3 kg)] 143 lb 15.4 oz (65.3 kg) (05/08 0500)    Intake/Output from previous day: 05/07 0701 - 05/08 0700 In: 3102.5 [I.V.:3012.5; IV Piggyback:90] Out: 3783 [Urine:3410; Drains:373] Intake/Output this shift: Total I/O In: 125 [I.V.:125] Out: 177 [Urine:150; Drains:27]  PE:  Alert/ oriented Vss; afeb Moves all 4s Lying still with eye mask on; h/a and some nausea For TCDs today CT this am shows improvement in hydrocephalus For cerebral arteriogram Mon 5/13 CT this am shows more improvement in   Lab Results:   [facility] 12/21/11 0409  WBC 7.5  HGB 12.0  HCT 34.1*  PLT 245   BMET  Basename 12/21/11 0409 12/20/11 0303  NA 134* 136  K 3.4* 3.6  CL 101 101  CO2 26 28  GLUCOSE 131* 131*  BUN 9 6  CREATININE 0.55 0.60  CALCIUM 8.6 9.1   PT/INR No results found for this basename: LABPROT:2,INR:2 in the last 72 hours ABG No results found for this basename: PHART:2,PCO2:2,PO2:2,HCO3:2 in the last 72 hours  Studies/Results: Ct Head Wo Contrast  12/21/2011  *RADIOLOGY REPORT*  Clinical Data: Follow-up hydrocephalus.  Recent subarachnoid hemorrhage.  CT HEAD WITHOUT CONTRAST  Technique:  Contiguous axial images were obtained from the base of the skull through the vertex without contrast.  Comparison: 12/20/2011 most recent  Findings: Interval decrease in ventricular size following adjustment of the ventricular drainage to lower the pressure. Biventricular diameter on image 14 is 37 mm, as  compared with 42 mm on 5/7.  Temporal horns are also less distended.  Persistent intraventricular blood.  Continued improvement subarachnoid blood.  No extra-axial fluid.  Ventricular catheter good position.  No areas suspicious for acute infarction or parenchymal hemorrhage. No mass lesion observed.  Moderate sinus disease with air-fluid levels in the sphenoid and maxillary region and near complete filling of the frontal and ethmoid sinuses.  IMPRESSION: Improved hydrocephalus as described.  Increasing bilateral sinus opacities.  Original Report Authenticated By: Elsie Stain, M.D.   Ct Head Wo Contrast  12/20/2011  *RADIOLOGY REPORT*  Clinical Data: Worsening neurologic status.  Recent subarachnoid hemorrhage.  CT HEAD WITHOUT CONTRAST  Technique:  Contiguous axial images were obtained from the base of the skull through the vertex without contrast.  Comparison: Multiple prior CT scans, most recent 12/18/2011.  Findings: Despite the satisfactory location of the right frontal ventricular drainage catheter, there is increasing hydrocephalus when compared with the examination 2 days ago.  Bifrontal diameter at the level of the foramen of Monro is 41 mm as compared with 32 mm on 12/18/2011.  Temporal horns are dilated.  There is improving subarachnoid blood, but persistent intraventricular blood.  No new subarachnoid blood is seen. There is no visible infarction or parenchymal hemorrhage.  No midline shift. No extra-axial fluid.  IMPRESSION: Worsening hydrocephalus despite good position of intraventricular drainage catheter.  Dr. Newell Coral aware.  No new subarachnoid blood.  Original Report Authenticated By: Elsie Stain, M.D.    Anti-infectives: Anti-infectives  Start     Dose/Rate Route Frequency Ordered Stop   12/17/11 1400   cefTRIAXone (ROCEPHIN) 1 g in dextrose 5 % 50 mL IVPB        1 g 100 mL/hr over 30 Minutes Intravenous Every 24 hours 12/17/11 1302     12/16/11 0400   azithromycin  (ZITHROMAX) 500 mg in dextrose 5 % 250 mL IVPB        500 mg 250 mL/hr over 60 Minutes Intravenous Every 24 hours 12/16/11 0313 12/17/11 0506          Assessment/Plan: s/p [facility] 5/3 Cerebral arteriogram- No neuro interventional radiology procedure Confusion and worsening h/a yesterday; CT showed more hydro New CT this am shows improvement Pt feels better For TCDs today. Scheduled for cerebral arteriogram 5/13  Danielle Riddle A 12/21/2011

## 2011-12-22 ENCOUNTER — Encounter (HOSPITAL_COMMUNITY): Payer: Self-pay | Admitting: Pharmacy Technician

## 2011-12-22 DIAGNOSIS — J45901 Unspecified asthma with (acute) exacerbation: Secondary | ICD-10-CM

## 2011-12-22 LAB — BASIC METABOLIC PANEL
Calcium: 9.2 mg/dL (ref 8.4–10.5)
GFR calc non Af Amer: >90 mL/min (ref >90)
Glucose, Bld: 129 mg/dL — ABNORMAL HIGH (ref 70–99)
Potassium: 4.3 mEq/L (ref 3.5–5.1)
Sodium: 137 mEq/L (ref 135–145)

## 2011-12-22 LAB — GLUCOSE, CAPILLARY: Glucose-Capillary: 185 mg/dL — ABNORMAL HIGH (ref 70–99)

## 2011-12-22 MED ORDER — DEXAMETHASONE SODIUM PHOSPHATE 4 MG/ML IJ SOLN
6.0000 mg | Freq: Four times a day (QID) | INTRAMUSCULAR | Status: AC
  Administered 2011-12-22 – 2011-12-23 (×4): 6 mg via INTRAVENOUS
  Filled 2011-12-22 (×4): qty 2

## 2011-12-22 MED ORDER — DEXAMETHASONE SODIUM PHOSPHATE 10 MG/ML IJ SOLN
INTRAMUSCULAR | Status: AC
  Administered 2011-12-22: 10 mg
  Filled 2011-12-22: qty 1

## 2011-12-22 MED ORDER — DEXAMETHASONE SODIUM PHOSPHATE 4 MG/ML IJ SOLN
10.0000 mg | Freq: Once | INTRAMUSCULAR | Status: AC
  Administered 2011-12-22: 10 mg via INTRAVENOUS

## 2011-12-22 NOTE — Progress Notes (Signed)
Danielle Riddle is a 66 y.o. female admitted on 12/16/2011 with headache, n/v, photophobia, and confusion.  Found to have SAH.  Had tx for asthma exacerbation, and sinusitis prior to admit (Followed by Dr. Shelle Iron as outpt).  PCCM consulted 5/3 for medical management while in ICU.  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  Sinus congestion improved.  Still has cough.  Breathing okay.  OBJECTIVE:  Blood pressure 127/57, pulse 68, temperature 98.6 F (37 C), temperature source Oral, resp. rate 14, height 5\' 6"  (1.676 m), weight 144 lb 6.4 oz (65.5 kg), SpO2 95.00%. Wt Readings from Last 3 Encounters:  12/22/11 144 lb 6.4 oz (65.5 kg)  12/14/11 143 lb 3.2 oz (64.955 kg)  09/20/11 143 lb 6.4 oz (65.046 kg)   Body mass index is 23.31 kg/(m^2).  I/O last 3 completed shifts: In: 5909.2 [P.O.:1480; I.V.:4379.2; IV Piggyback:50] Out: 7528 [Urine:7000; Drains:528]  Physical Exam: General - no distress HEENT - clear nasal drainage Cardiac - s1s2 regular Chest - good air entry, no wheeze/rales Abd - soft, nontender Ext - no edema Neuro - sleepy, normal strength Psych - normal mood, behavior  CBC    Component Value Date/Time   WBC 7.5 12/21/2011 0409   RBC 3.78* 12/21/2011 0409   HGB 12.0 12/21/2011 0409   HCT 34.1* 12/21/2011 0409   PLT 245 12/21/2011 0409   MCV 90.2 12/21/2011 0409   MCH 31.7 12/21/2011 0409   MCHC 35.2 12/21/2011 0409   RDW 12.6 12/21/2011 0409   LYMPHSABS 1.0 12/16/2011 0259   MONOABS 0.8 12/16/2011 0259   EOSABS 0.0 12/16/2011 0259   BASOSABS 0.0 12/16/2011 0259    BMET    Component Value Date/Time   NA 137 12/22/2011 0430   K 4.3 12/22/2011 0430   CL 102 12/22/2011 0430   CO2 28 12/22/2011 0430   GLUCOSE 129* 12/22/2011 0430   BUN 7  12/22/2011 0430   CREATININE 0.58 12/22/2011 0430   CALCIUM 9.2 12/22/2011 0430   GFRNONAA >90 12/22/2011 0430   GFRAA >90 12/22/2011 0430     ASSESSMENT/PLAN:  SAH s/p Ventricular drain.  Intermittently has severe headaches. -Drain per neurosurgery -goal SBP 120 to 140>>continue IV fluid -continue nimodipine and monitor for vasospasm>>f/u TCD intermittently -keppra for anti-sz prophylaxis per neurosurgery -rocephin for post-drain prophylaxis -control headache>>prn tylenol, percocet, morphine -neurosurgery adding decadron 5/9  Acute on chronic sinusitis -continue flonase to bid -continue singulair, loratidine -saline irrigation bid and prn -D6/x rocephin  Asthma with persistent cough -d/c pulmicort while she is getting decadron -continue albuterol to bid and q4h prn  Cough likely from post-nasal drip -continue hycodan for cough suppression  Constipation likely from narcotic use -continue bowel regimen  Steroid induced hyperglycemia -change to CHO modified diet -if CBG's elevated may need to start SSI  Best practice -SCD for DVT prophylaxis -Protonix for SUP -HOB elevated  Updated husband at bedside about plan   Deatra Mcmahen Pager:  [phone] 12/22/2011, 8:44 AM

## 2011-12-22 NOTE — Progress Notes (Signed)
Subjective: He and patient continued to complain of disabling headache and neck pain. Her continue to receive morphine for the neck pain and headache, but does not provide much in the way of relief. Also has significant photophobia and uses eye blinders. TCDs stable.  Objective: Vital signs in last 24 hours: Filed Vitals:   12/22/11 0500 12/22/11 0600 12/22/11 0700 12/22/11 0800  BP: 107/57 120/71 127/66 127/57  Pulse: 67 72  68  Temp:   98.6 F (37 C)   TempSrc:   Oral   Resp: 15 17 15 14   Height:      Weight: 65.5 kg (144 lb 6.4 oz)     SpO2: 95% 96% 96% 95%    Intake/Output from previous day: 05/08 0701 - 05/09 0700 In: 4409.2 [P.O.:1480; I.V.:2879.2; IV Piggyback:50] Out: 1610 [Urine:5425; Drains:338] Intake/Output this shift: Total I/O In: 125 [I.V.:125] Out: 361 [Urine:350; Drains:11]  Physical Exam:  Awake and alert, oriented to name, [facility] hospital, and May 2013. 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 Pupils 1.5 mm, round and minimally reactive to light. Extra ocular movements intact. Facial movement symmetrical. No drift of upper extremities.  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 We'll add Decadron to see if that'll help with meningitic symptoms. Continuing Nimotop. Continue TCDs. Spoke with patient and her husband at bedside about her condition and her plans for treatment and care.   Hewitt Shorts, MD 12/22/2011, 8:34 AM

## 2011-12-22 NOTE — Progress Notes (Signed)
Subjective: SAH 5/3 Cerebral arteriogram without intervention 5/3 ventric 5/4 Still has h/a and neck pain.   Objective: Vital signs in last 24 hours: Temp:  [98 F (36.7 C)-98.8 F (37.1 C)] 98.6 F (37 C) (05/09 0700) Pulse Rate:  [64-74] 71  (05/09 0900) Resp:  [12-17] 13  (05/09 0900) BP: (105-143)/(51-78) 117/65 mmHg (05/09 0900) SpO2:  [90 %-97 %] 94 % (05/09 0900) Weight:  [144 lb 6.4 oz (65.5 kg)] 144 lb 6.4 oz (65.5 kg) (05/09 0500)    Intake/Output from previous day: 05/08 0701 - 05/09 0700 In: 4409.2 [P.O.:1480; I.V.:2879.2; IV Piggyback:50] Out: 1610 [Urine:5425; Drains:338] Intake/Output this shift: Total I/O In: 258 [I.V.:250; IV Piggyback:8] Out: 588 [Urine:550; Drains:38]  PE:  Alert/ oriented. Facial movement sym. EOMI    Lab Results:   [facility] 12/21/11 0409  WBC 7.5  HGB 12.0  HCT 34.1*  PLT 245   BMET  Basename 12/22/11 0430 12/21/11 0409  NA 137 134*  K 4.3 3.4*  CL 102 101  CO2 28 26  GLUCOSE 129* 131*  BUN 7 9  CREATININE 0.58 0.55  CALCIUM 9.2 8.6   PT/INR No results found for this basename: LABPROT:2,INR:2 in the last 72 hours ABG No results found for this basename: PHART:2,PCO2:2,PO2:2,HCO3:2 in the last 72 hours  Studies/Results: Ct Head Wo Contrast  12/21/2011  *RADIOLOGY REPORT*  Clinical Data: Follow-up hydrocephalus.  Recent subarachnoid hemorrhage.  CT HEAD WITHOUT CONTRAST  Technique:  Contiguous axial images were obtained from the base of the skull through the vertex without contrast.  Comparison: 12/20/2011 most recent  Findings: Interval decrease in ventricular size following adjustment of the ventricular drainage to lower the pressure. Biventricular diameter on image 14 is 37 mm, as compared with 42 mm on 5/7.  Temporal horns are also less distended.  Persistent intraventricular blood.  Continued improvement subarachnoid blood.  No extra-axial fluid.  Ventricular catheter good position.  No areas suspicious for acute  infarction or parenchymal hemorrhage. No mass lesion observed.  Moderate sinus disease with air-fluid levels in the sphenoid and maxillary region and near complete filling of the frontal and ethmoid sinuses.  IMPRESSION: Improved hydrocephalus as described.  Increasing bilateral sinus opacities.  Original Report Authenticated By: Elsie Stain, M.D.   Ct Head Wo Contrast  12/20/2011  *RADIOLOGY REPORT*  Clinical Data: Worsening neurologic status.  Recent subarachnoid hemorrhage.  CT HEAD WITHOUT CONTRAST  Technique:  Contiguous axial images were obtained from the base of the skull through the vertex without contrast.  Comparison: Multiple prior CT scans, most recent 12/18/2011.  Findings: Despite the satisfactory location of the right frontal ventricular drainage catheter, there is increasing hydrocephalus when compared with the examination 2 days ago.  Bifrontal diameter at the level of the foramen of Monro is 41 mm as compared with 32 mm on 12/18/2011.  Temporal horns are dilated.  There is improving subarachnoid blood, but persistent intraventricular blood.  No new subarachnoid blood is seen. There is no visible infarction or parenchymal hemorrhage.  No midline shift. No extra-axial fluid.  IMPRESSION: Worsening hydrocephalus despite good position of intraventricular drainage catheter.  Dr. Newell Coral aware.  No new subarachnoid blood.  Original Report Authenticated By: Elsie Stain, M.D.    Anti-infectives: Anti-infectives     Start     Dose/Rate Route Frequency Ordered Stop   12/17/11 1400   cefTRIAXone (ROCEPHIN) 1 g in dextrose 5 % 50 mL IVPB        1 g 100 mL/hr over  30 Minutes Intravenous Every 24 hours 12/17/11 1302     12/16/11 0400   azithromycin (ZITHROMAX) 500 mg in dextrose 5 % 250 mL IVPB        500 mg 250 mL/hr over 60 Minutes Intravenous Every 24 hours 12/16/11 0313 12/17/11 0506          Assessment/Plan: s/p [facility] 5/3 Cerebral arteriogram- No neuro interventional radiology  procedure Decadron being added for meningitic sxs. Scheduled for cerebral arteriogram 5/13  Brayton El 12/22/2011

## 2011-12-23 DIAGNOSIS — I609 Nontraumatic subarachnoid hemorrhage, unspecified: Secondary | ICD-10-CM

## 2011-12-23 LAB — BASIC METABOLIC PANEL
Chloride: 99 mEq/L (ref 96–112)
GFR calc Af Amer: >90 mL/min (ref >90)
GFR calc non Af Amer: >90 mL/min (ref >90)
Potassium: 4.4 mEq/L (ref 3.5–5.1)
Sodium: 134 mEq/L — ABNORMAL LOW (ref 135–145)

## 2011-12-23 LAB — GLUCOSE, CAPILLARY: Glucose-Capillary: 192 mg/dL — ABNORMAL HIGH (ref 70–99)

## 2011-12-23 LAB — CBC
HCT: 38.3 % (ref 36.0–46.0)
Hemoglobin: 13.2 g/dL (ref 12.0–15.0)
RBC: 4.19 MIL/uL (ref 3.87–5.11)
WBC: 8.5 10*3/uL (ref 4.0–10.5)

## 2011-12-23 MED ORDER — DEXAMETHASONE SODIUM PHOSPHATE 10 MG/ML IJ SOLN
6.0000 mg | Freq: Once | INTRAMUSCULAR | Status: AC
  Administered 2011-12-23: 6 mg via INTRAVENOUS
  Filled 2011-12-23: qty 0.6
  Filled 2011-12-23: qty 1

## 2011-12-23 MED ORDER — DIPHENHYDRAMINE HCL 25 MG PO CAPS
25.0000 mg | ORAL_CAPSULE | Freq: Four times a day (QID) | ORAL | Status: DC | PRN
  Administered 2011-12-23: 25 mg via ORAL
  Filled 2011-12-23: qty 1

## 2011-12-23 MED ORDER — DEXAMETHASONE SODIUM PHOSPHATE 10 MG/ML IJ SOLN
10.0000 mg | Freq: Once | INTRAMUSCULAR | Status: AC
  Administered 2011-12-23: 10 mg via INTRAVENOUS

## 2011-12-23 MED ORDER — DEXAMETHASONE SODIUM PHOSPHATE 4 MG/ML IJ SOLN: 4.0000 mg | Freq: Four times a day (QID) | INTRAMUSCULAR | Status: DC

## 2011-12-23 MED ORDER — INSULIN ASPART 100 UNIT/ML ~~LOC~~ SOLN
0.0000 [IU] | Freq: Three times a day (TID) | SUBCUTANEOUS | Status: DC
  Administered 2011-12-24: 4 [IU] via SUBCUTANEOUS
  Administered 2011-12-24 – 2011-12-25 (×2): 3 [IU] via SUBCUTANEOUS
  Administered 2011-12-25: 4 [IU] via SUBCUTANEOUS
  Administered 2011-12-26: 3 [IU] via SUBCUTANEOUS
  Administered 2011-12-26: 7 [IU] via SUBCUTANEOUS
  Administered 2011-12-27: 3 [IU] via SUBCUTANEOUS
  Administered 2011-12-27 (×2): 4 [IU] via SUBCUTANEOUS
  Administered 2011-12-28 (×2): 3 [IU] via SUBCUTANEOUS
  Administered 2011-12-29: 4 [IU] via SUBCUTANEOUS
  Administered 2011-12-29: 3 [IU] via SUBCUTANEOUS
  Administered 2011-12-30: 7 [IU] via SUBCUTANEOUS
  Administered 2011-12-30: 4 [IU] via SUBCUTANEOUS
  Administered 2011-12-30 – 2012-01-01 (×4): 3 [IU] via SUBCUTANEOUS
  Administered 2012-01-01: 2 [IU] via SUBCUTANEOUS

## 2011-12-23 MED ORDER — DEXAMETHASONE SODIUM PHOSPHATE 4 MG/ML IJ SOLN
4.0000 mg | Freq: Four times a day (QID) | INTRAMUSCULAR | Status: DC
  Administered 2011-12-24 – 2011-12-31 (×29): 4 mg via INTRAVENOUS
  Filled 2011-12-23 (×38): qty 1

## 2011-12-23 NOTE — Progress Notes (Signed)
Subjective: Pt awake/alert, family in room; still with HA , primarily on top of head ,worse with coughing; some mild improvement with decadron; intermittent nausea, no vomiting; photophobia  Objective: Vital signs in last 24 hours: Temp:  [97.9 F (36.6 C)-98.7 F (37.1 C)] 97.9 F (36.6 C) (05/10 1200) Pulse Rate:  [62-81] 72  (05/10 1300) Resp:  [13-18] 14  (05/10 1300) BP: (95-131)/(51-86) 124/63 mmHg (05/10 1300) SpO2:  [93 %-97 %] 97 % (05/10 1300) Weight:  [143 lb 1.3 oz (64.9 kg)] 143 lb 1.3 oz (64.9 kg) (05/10 0400)    Intake/Output from previous day: 05/09 0701 - 05/10 0700 In: 3058 [I.V.:3000; IV Piggyback:58] Out: 5354 [Urine:5070; Drains:284] Intake/Output this shift:    Pt alert and oriented,PERRL, speech nl, face symmetrical, moving all four extremities well, no drift; blood-tinged CSF in ventriculostomy; 5/8 TCD nl  Lab Results:   Basename 12/23/11 0400 12/21/11 0409  WBC 8.5 7.5  HGB 13.2 12.0  HCT 38.3 34.1*  PLT 310 245   BMET  Basename 12/23/11 0400 12/22/11 0430  NA 134* 137  K 4.4 4.3  CL 99 102  CO2 25 28  GLUCOSE 147* 129*  BUN 9 7  CREATININE 0.47* 0.58  CALCIUM 9.7 9.2   PT/INR No results found for this basename: LABPROT:2,INR:2 in the last 72 hours ABG No results found for this basename: PHART:2,PCO2:2,PO2:2,HCO3:2 in the last 72 hours Ct Head Wo Contrast  12/21/2011  *RADIOLOGY REPORT*  Clinical Data: Follow-up hydrocephalus.  Recent subarachnoid hemorrhage.  CT HEAD WITHOUT CONTRAST  Technique:  Contiguous axial images were obtained from the base of the skull through the vertex without contrast.  Comparison: 12/20/2011 most recent  Findings: Interval decrease in ventricular size following adjustment of the ventricular drainage to lower the pressure. Biventricular diameter on image 14 is 37 mm, as compared with 42 mm on 5/7.  Temporal horns are also less distended.  Persistent intraventricular blood.  Continued improvement subarachnoid  blood.  No extra-axial fluid.  Ventricular catheter good position.  No areas suspicious for acute infarction or parenchymal hemorrhage. No mass lesion observed.  Moderate sinus disease with air-fluid levels in the sphenoid and maxillary region and near complete filling of the frontal and ethmoid sinuses.  IMPRESSION: Improved hydrocephalus as described.  Increasing bilateral sinus opacities.  Original Report Authenticated By: Elsie Stain, M.D.   Ct Head Wo Contrast  12/20/2011  *RADIOLOGY REPORT*  Clinical Data: Worsening neurologic status.  Recent subarachnoid hemorrhage.  CT HEAD WITHOUT CONTRAST  Technique:  Contiguous axial images were obtained from the base of the skull through the vertex without contrast.  Comparison: Multiple prior CT scans, most recent 12/18/2011.  Findings: Despite the satisfactory location of the right frontal ventricular drainage catheter, there is increasing hydrocephalus when compared with the examination 2 days ago.  Bifrontal diameter at the level of the foramen of Monro is 41 mm as compared with 32 mm on 12/18/2011.  Temporal horns are dilated.  There is improving subarachnoid blood, but persistent intraventricular blood.  No new subarachnoid blood is seen. There is no visible infarction or parenchymal hemorrhage.  No midline shift. No extra-axial fluid.  IMPRESSION: Worsening hydrocephalus despite good position of intraventricular drainage catheter.  Dr. Newell Coral aware.  No new subarachnoid blood.  Original Report Authenticated By: Elsie Stain, M.D.   Ct Head Wo Contrast  12/18/2011  *RADIOLOGY REPORT*  Clinical Data: Follow up subarachnoid hemorrhage.  Worsening headache.  CT HEAD WITHOUT CONTRAST 12/18/2011:  Technique:  Contiguous  axial images were obtained from the base of the skull through the vertex without contrast.  Comparison: Unenhanced cranial CT 12/16/2011, 09/19/2010.  Findings: Interval right frontal ventriculostomy catheter whose tip is in the frontal horn  of the right lateral ventricle.  Interval decrease in size of the ventricles since the examination 2 days ago.  Persistent hemorrhage in the lateral, third, and fourth ventricles.  Diffuse subarachnoid hemorrhage which is similar in distribution but has decreased in amount since the prior examination.  No new areas of hemorrhage.  No midline shift.  Near complete opacification of the ethmoid air cells bilaterally, with air-fluid levels and posterior ethmoid air cells bilaterally.  Mucosal thickening and air fluid level in the left sphenoid sinus. Opacification of the frontal sinuses bilaterally, left greater than right.  Mucosal thickening involving the maxillary sinuses, with prior left medial antrostomy.  Complete opacification of the left nasal cavity.  IMPRESSION:  1.  Interval decrease in the amount of diffuse subarachnoid hemorrhage since the examination 2 days ago, though a moderate amount of subarachnoid blood persists. 2.  Interval decrease in ventricular size post right frontal ventriculostomy placement. 3.  Persistent hemorrhage in the lateral, third, and fourth ventricles. 4.  No midline shift.  No new intracranial abnormalities. 5.  Acute superimposed upon chronic pansinusitis.  Complete opacification of the left nasal cavity.  Original Report Authenticated By: Arnell Sieving, M.D.   Ct Head Wo Contrast  12/16/2011  *RADIOLOGY REPORT*  Clinical Data: Subarachnoid hemorrhage.  Increasing lethargy.  CT HEAD WITHOUT CONTRAST  Technique:  Contiguous axial images were obtained from the base of the skull through the vertex without contrast.  Comparison: 12/16/2011  Findings: Diffuse subarachnoid hemorrhage again noted, similar to prior study.  Increasing intraventricular blood, now layering in the posterior horns of the lateral ventricles.  Ventricular size has increased since prior study.  No midline shift.  No parenchymal hemorrhage.  Chronic sinusitis changes throughout the paranasal sinuses.  No  acute calvarial abnormality.  IMPRESSION: Continued large diffuse subarachnoid hemorrhage.  Increasing intraventricular blood and ventricular size.  Original Report Authenticated By: Cyndie Chime, M.D.   Portable Chest Xray  12/16/2011  *RADIOLOGY REPORT*  Clinical Data: Subarachnoid hemorrhage, preoperative radiograph  PORTABLE CHEST - 1 VIEW  Comparison: 09/25/2006  Findings: Prominent pulmonary vessels.  Heart size upper normal. Mild lingular opacity.  No pneumothorax.  No acute osseous abnormality.  IMPRESSION:  Mild lingular opacity, atelectasis versus infiltrate.  Original Report Authenticated By: Waneta Martins, M.D.   Ir Angio Intra Extracran Sel Com Carotid Innominate Bilat Mod Sed  12/22/2011  *RADIOLOGY REPORT*  Clinical Data: Acute onset of headaches, nausea, vomiting.  CT scan of the brain revealing subarachnoid hemorrhage.  BILATERAL COMMON CAROTID AND BILATERAL VERTEBRAL ARTERY ANGIOGRAMS  Comparison: CT of the brain of 12/16/2011.  Following a full explanation of the procedure along with the potential associated complications, an informed witnessed consent was obtained.  The right groin was prepped and draped in the usual sterile fashion.  Thereafter using modified Seldinger technique, transfemoral access into the right common femoral artery was obtained without difficulty.  Over a 0.035-inch guidewire, a 5- [place] Pinnacle sheath was inserted.  Through this and also over a 0.035-inch guidewire, a 5-French JB1 catheter was advanced to the aortic arch region and selectively positioned in the right common carotid artery, the right vertebral artery, the left common carotid artery and the left vertebral artery.  There were no acute complications.  The patient tolerated the procedure  well.  Medications utilized: Versed 4 mg IV.  Dilaudid 2 mg IV.  Fentanyl 100 mcg IV.  Contrast: Omnipaque-300 approximately 60 ml.  Findings:  The right vertebral artery origin is normal.  The vessel is seen to  opacify normally to the cranial skull base.  There is normal opacification of the right posterior-inferior cerebellar artery and the right vertebrobasilar junction.  The opacified portion of the basilar artery, the posterior cerebral arteries, the superior cerebellar arteries and the anterior- inferior cerebellar arteries is normal into the capillary and venous phases.  Unopacified blood is seen in the basilar artery from the contralateral vertebral artery.  The right common carotid arteriogram demonstrates the right external carotid artery and its major branches to be normal.  The right internal carotid artery at the bulb to the cranial skull base opacifies normally.  The petrous, the cavernous and the supraclinoid segments are normal.  The right middle and the right anterior cerebral arteries opacify normally into the capillary and venous phases.  The left common carotid arteriogram demonstrates the left external carotid artery and its major branches to be normal.  The left internal carotid artery at the bulb to the cranial skull base opacifies normally.  The petrous, the cavernous and the supraclinoid segments are normal.  The left middle and the left anterior cerebral arteries opacify normally into the capillary and venous phases.  The left vertebral artery origin is normal.  The vessel opacifies normally to the cranial skull base.  There is normal opacification of the left posterior-inferior cerebellar artery and the left vertebrobasilar junction.  The basilar artery, the posterior cerebral arteries, the superior cerebellar arteries and the anterior-inferior cerebellar arteries opacify normally into the capillary and venous phases.  Unopacified blood is seen in the basilar artery from the contralateral vertebral artery.  IMPRESSION: 1.  Angiographically no evidence of an aneurysm, intracranial dissection, stenosis, arteriovenous malformation or of dural AV fistula.  2.  Venous return within normal limits.   Original Report Authenticated By: Oneal Grout, M.D.   Ir Angio Vertebral Sel Vertebral Bilat Mod Sed  12/22/2011  *RADIOLOGY REPORT*  Clinical Data: Acute onset of headaches, nausea, vomiting.  CT scan of the brain revealing subarachnoid hemorrhage.  BILATERAL COMMON CAROTID AND BILATERAL VERTEBRAL ARTERY ANGIOGRAMS  Comparison: CT of the brain of 12/16/2011.  Following a full explanation of the procedure along with the potential associated complications, an informed witnessed consent was obtained.  The right groin was prepped and draped in the usual sterile fashion.  Thereafter using modified Seldinger technique, transfemoral access into the right common femoral artery was obtained without difficulty.  Over a 0.035-inch guidewire, a 5- [place] Pinnacle sheath was inserted.  Through this and also over a 0.035-inch guidewire, a 5-French JB1 catheter was advanced to the aortic arch region and selectively positioned in the right common carotid artery, the right vertebral artery, the left common carotid artery and the left vertebral artery.  There were no acute complications.  The patient tolerated the procedure well.  Medications utilized: Versed 4 mg IV.  Dilaudid 2 mg IV.  Fentanyl 100 mcg IV.  Contrast: Omnipaque-300 approximately 60 ml.  Findings:  The right vertebral artery origin is normal.  The vessel is seen to opacify normally to the cranial skull base.  There is normal opacification of the right posterior-inferior cerebellar artery and the right vertebrobasilar junction.  The opacified portion of the basilar artery, the posterior cerebral arteries, the superior cerebellar arteries and the anterior- inferior cerebellar arteries  is normal into the capillary and venous phases.  Unopacified blood is seen in the basilar artery from the contralateral vertebral artery.  The right common carotid arteriogram demonstrates the right external carotid artery and its major branches to be normal.  The right  internal carotid artery at the bulb to the cranial skull base opacifies normally.  The petrous, the cavernous and the supraclinoid segments are normal.  The right middle and the right anterior cerebral arteries opacify normally into the capillary and venous phases.  The left common carotid arteriogram demonstrates the left external carotid artery and its major branches to be normal.  The left internal carotid artery at the bulb to the cranial skull base opacifies normally.  The petrous, the cavernous and the supraclinoid segments are normal.  The left middle and the left anterior cerebral arteries opacify normally into the capillary and venous phases.  The left vertebral artery origin is normal.  The vessel opacifies normally to the cranial skull base.  There is normal opacification of the left posterior-inferior cerebellar artery and the left vertebrobasilar junction.  The basilar artery, the posterior cerebral arteries, the superior cerebellar arteries and the anterior-inferior cerebellar arteries opacify normally into the capillary and venous phases.  Unopacified blood is seen in the basilar artery from the contralateral vertebral artery.  IMPRESSION: 1.  Angiographically no evidence of an aneurysm, intracranial dissection, stenosis, arteriovenous malformation or of dural AV fistula.  2.  Venous return within normal limits.  Original Report Authenticated By: Oneal Grout, M.D.   Studies/Results: No results found.  Anti-infectives: Anti-infectives     Start     Dose/Rate Route Frequency Ordered Stop   12/17/11 1400   cefTRIAXone (ROCEPHIN) 1 g in dextrose 5 % 50 mL IVPB        1 g 100 mL/hr over 30 Minutes Intravenous Every 24 hours 12/17/11 1302     12/16/11 0400   azithromycin (ZITHROMAX) 500 mg in dextrose 5 % 250 mL IVPB        500 mg 250 mL/hr over 60 Minutes Intravenous Every 24 hours 12/16/11 0313 12/17/11 0506          Assessment/Plan: s/p SAH with neg initial cerebral angio  5/3; scheduled for repeat cerebral angio 5/13 am; pt/family aware of plans; will place preprocedure orders 5/12; other plans as per neurosurgery/CCM   LOS: 7 days    Jiovanna Frei,D [facility] 12/23/2011

## 2011-12-23 NOTE — Progress Notes (Addendum)
VASCULAR LAB PRELIMINARY  PRELIMINARY  PRELIMINARY  PRELIMINARY  TCD completed.    Preliminary report:  Results reported with results of 12-16-2011  Kerrin Champagne, RVS 12/23/2011, 4:47 PM

## 2011-12-23 NOTE — Progress Notes (Signed)
Subjective: Patient resting in bed. Still complaining of headache. IVC draining well. CSF remains serosanguineous.  Objective: Vital signs in last 24 hours: Filed Vitals:   12/23/11 0400 12/23/11 0500 12/23/11 0600 12/23/11 0700  BP: 127/68 111/51 119/73 112/58  Pulse: 70 66 67 62  Temp:    97.9 F (36.6 C)  TempSrc:    Oral  Resp: 16 15 14 13   Height:      Weight: 64.9 kg (143 lb 1.3 oz)     SpO2: 93% 93% 96% 95%    Intake/Output from previous day: 05/09 0701 - 05/10 0700 In: 3058 [I.V.:3000; IV Piggyback:58] Out: 5354 [Urine:5070; Drains:284] Intake/Output this shift:    Physical Exam:  Patient clearly appears less uncomfortable. More alert, oriented fully. Following commands. 1-2+ meningismus. Pupils 2 mm round reactive to light. A truck of movements intact. Facial movement symmetrical. No drift of upper extremities. Moving all 4 extremity as well.  CBC  Basename 12/23/11 0400 12/21/11 0409  WBC 8.5 7.5  HGB 13.2 12.0  HCT 38.3 34.1*  PLT 310 245   BMET  Basename 12/23/11 0400 12/22/11 0430  NA 134* 137  K 4.4 4.3  CL 99 102  CO2 25 28  GLUCOSE 147* 129*  BUN 9 7  CREATININE 0.47* 0.58  CALCIUM 9.7 9.2    Assessment/Plan: Patient clearly more comfortable after having added Decadron yesterday, more alert, less meningismus. We'll continue current care including IVC drainage, Nimotop, TCDs today.   Hewitt Shorts, MD 12/23/2011, 8:17 AM

## 2011-12-23 NOTE — Progress Notes (Signed)
Danielle Riddle is a 66 y.o. female admitted on 12/16/2011 with headache, n/v, photophobia, and confusion.  Found to have SAH.  Had tx for asthma exacerbation, and sinusitis prior to admit (Followed by Dr. Shelle Iron as outpt).  PCCM consulted 5/3 for medical management while in ICU.  Line/tube: Rt ventricular drain 5/3>>  Cultures: MRSA screen 5/3>>negative  Antibiotics: Zithromax 5/1>>5/4 Rocephin 5/4>>  Tests/events: 5/3 CT head>>SAH, no hydrocephalus 5/5 CT head>>decrease in SAH, no change in IVH, acute on chronic pansinusitis  SUBJECTIVE: Headache, neck pain improved since starting decadron.  OBJECTIVE:  Blood pressure 109/86, pulse 71, temperature 97.9 F (36.6 C), temperature source Oral, resp. rate 16, height 5\' 6"  (1.676 m), weight 143 lb 1.3 oz (64.9 kg), SpO2 96.00%. Wt Readings from Last 3 Encounters:  12/23/11 143 lb 1.3 oz (64.9 kg)  12/14/11 143 lb 3.2 oz (64.955 kg)  09/20/11 143 lb 6.4 oz (65.046 kg)   Body mass index is 23.09 kg/(m^2).  I/O last 3 completed shifts: In: 4800.1 [P.O.:240; I.V.:4502.1; IV Piggyback:58] Out: 1610 [RUEAV:4098; Drains:458]  Physical Exam: General - no distress HEENT - clear nasal drainage Cardiac - s1s2 regular Chest - good air entry, no wheeze/rales Abd - soft, nontender Ext - no edema Neuro - more alert, normal strength Psych - normal mood, behavior  CBC    Component Value Date/Time   WBC 8.5 12/23/2011 0400   RBC 4.19 12/23/2011 0400   HGB 13.2 12/23/2011 0400   HCT 38.3 12/23/2011 0400   PLT 310 12/23/2011 0400   MCV 91.4 12/23/2011 0400   MCH 31.5 12/23/2011 0400   MCHC 34.5 12/23/2011 0400   RDW 13.0 12/23/2011 0400   LYMPHSABS 1.0 12/16/2011 0259   MONOABS 0.8 12/16/2011 0259   EOSABS 0.0 12/16/2011 0259   BASOSABS 0.0 12/16/2011 0259    BMET    Component Value Date/Time   NA 134* 12/23/2011 0400   K 4.4 12/23/2011 0400   CL 99 12/23/2011 0400   CO2 25 12/23/2011 0400   GLUCOSE 147* 12/23/2011 0400   BUN 9 12/23/2011 0400     CREATININE 0.47* 12/23/2011 0400   CALCIUM 9.7 12/23/2011 0400   GFRNONAA >90 12/23/2011 0400   GFRAA >90 12/23/2011 0400   CBG (last 3)   Basename 12/22/11 2159 12/20/11 2207 12/20/11 1522  GLUCAP 185* 136* 148*     ASSESSMENT/PLAN:  SAH s/p Ventricular drain.  Intermittently has severe headaches. -Drain per neurosurgery -goal SBP 120 to 140>>continue IV fluid -continue nimodipine and monitor for vasospasm>>f/u TCD intermittently -keppra for anti-sz prophylaxis per neurosurgery -rocephin for post-drain prophylaxis -control headache>>prn tylenol, percocet, morphine -neurosurgery started decadron 5/9 for meningismus  Acute on chronic sinusitis -continue flonase to bid -continue singulair, loratidine -saline irrigation bid and prn -D7/x rocephin  Asthma with persistent cough -d/c pulmicort while she is getting decadron -continue albuterol to bid and q4h prn  Cough likely from post-nasal drip -continue hycodan for cough suppression  Constipation likely from narcotic use -continue bowel regimen  Steroid induced hyperglycemia -changed to CHO modified diet -start SSI 5/10  Best practice -SCD for DVT prophylaxis -Protonix for SUP -HOB elevated  Updated husband at bedside about plan   Gregery Walberg Pager:  [phone] 12/23/2011, 10:27 AM

## 2011-12-24 ENCOUNTER — Inpatient Hospital Stay (HOSPITAL_COMMUNITY): Payer: PRIVATE HEALTH INSURANCE

## 2011-12-24 LAB — BASIC METABOLIC PANEL
Calcium: 9.6 mg/dL (ref 8.4–10.5)
GFR calc Af Amer: >90 mL/min (ref >90)
GFR calc non Af Amer: >90 mL/min (ref >90)
Glucose, Bld: 163 mg/dL — ABNORMAL HIGH (ref 70–99)
Potassium: 4.2 mEq/L (ref 3.5–5.1)
Sodium: 135 mEq/L (ref 135–145)

## 2011-12-24 LAB — GLUCOSE, CAPILLARY
Glucose-Capillary: 115 mg/dL — ABNORMAL HIGH (ref 70–99)
Glucose-Capillary: 135 mg/dL — ABNORMAL HIGH (ref 70–99)

## 2011-12-24 NOTE — Progress Notes (Signed)
Subjective: Patient resting in bed. Continuing to complain of headache and neck pain. Missed noontime dose of Decadron yesterday. Continuing to have upper respiratory congestion. CT this morning shows some further decrease in ventricular size, as well as further clearing of subarachnoid hemorrhage and intraventricular hemorrhage. TCDs yesterday showed good velocities.  Objective: Vital signs in last 24 hours: Filed Vitals:   12/24/11 0400 12/24/11 0500 12/24/11 0600 12/24/11 0700  BP: 133/68 137/74 132/66 129/69  Pulse: 68 68 66 63  Temp:      TempSrc:      Resp: 15 17 13 12   Height:      Weight:  66.3 kg (146 lb 2.6 oz)    SpO2: 96% 96% 97% 95%    Intake/Output from previous day: 05/10 0701 - 05/11 0700 In: 4605 [P.O.:1680; I.V.:2875; IV Piggyback:50] Out: 1610 [Urine:6400; Drains:242] Intake/Output this shift:    Physical Exam:  [facility] and alert[facility] Following commands. Speech is fluent. Extra ocular movements intact. Facial movements symmetrical. No drift of upper extremities. 2+ meningismus.  CBC  Basename 12/23/11 0400  WBC 8.5  HGB 13.2  HCT 38.3  PLT 310   BMET  Basename 12/24/11 0355 12/23/11 0400  NA 135 134*  K 4.2 4.4  CL 98 99  CO2 27 25  GLUCOSE 163* 147*  BUN 15 9  CREATININE 0.55 0.47*  CALCIUM 9.6 9.7    Assessment/Plan: Continued significant meningitic symptoms, with headache and neck pain. Overall though better than prior to starting Decadron. For diagnostic angiogram on Monday, May 13. The patient and her husband do not want any intervention performed at this time in interventional radiology, but rather solely a diagnostic study, and the patient's husband has discussed this with Dr. Link Snuffer.   Hewitt Shorts, MD 12/24/2011, 8:10 AM

## 2011-12-24 NOTE — Progress Notes (Signed)
Danielle Riddle is a 66 y.o. female admitted on 12/16/2011 with headache, n/v, photophobia, and confusion.  Found to have SAH.  Had tx for asthma exacerbation, and sinusitis prior to admit (Followed by Dr. Shelle Iron as outpt).  PCCM consulted 5/3 for medical management while in ICU.  Line/tube: Rt ventricular drain 5/3>>   Cultures: MRSA screen 5/3>>negative  Antibiotics: Zithromax 5/1>>5/4 Rocephin 5/4>>  Tests/events: 5/3 CT head>>SAH, no hydrocephalus 5/5 CT head>>decrease in [facility], no change in IVH, acute on chronic pansinusitis  SUBJECTIVE: Planning for repeat cerebral angio on 5/13, the drain will stay in until then Still w some cough -- causes terrible HA  OBJECTIVE:  Blood pressure 123/69, pulse 62, temperature 98.4 F (36.9 C), temperature source Oral, resp. rate 11, height 5\' 6"  (1.676 m), weight 66.3 kg (146 lb 2.6 oz), SpO2 94.00%. Wt Readings from Last 3 Encounters:  12/24/11 66.3 kg (146 lb 2.6 oz)  12/14/11 64.955 kg (143 lb 3.2 oz)  09/20/11 65.046 kg (143 lb 6.4 oz)   Body mass index is 23.59 kg/(m^2).  I/O last 3 completed shifts: In: 6230 [P.O.:1680; I.V.:4500; IV Piggyback:50] Out: 1914 [NWGNF:6213; Drains:413]  Physical Exam: General - no distress HEENT - clear nasal drainage Cardiac - s1s2 regular Chest - good air entry, no wheeze/rales Abd - soft, nontender Ext - no edema Neuro - sleepy, normal strength Psych - normal mood, behavior  CBC    Component Value Date/Time   WBC 8.5 12/23/2011 0400   RBC 4.19 12/23/2011 0400   HGB 13.2 12/23/2011 0400   HCT 38.3 12/23/2011 0400   PLT 310 12/23/2011 0400   MCV 91.4 12/23/2011 0400   MCH 31.5 12/23/2011 0400   MCHC 34.5 12/23/2011 0400   RDW 13.0 12/23/2011 0400   LYMPHSABS 1.0 12/16/2011 0259   MONOABS 0.8 12/16/2011 0259   EOSABS 0.0 12/16/2011 0259   BASOSABS 0.0 12/16/2011 0259    BMET    Component Value Date/Time   NA 135 12/24/2011 0355   K 4.2 12/24/2011 0355   CL 98 12/24/2011 0355   CO2 27 12/24/2011  0355   GLUCOSE 163* 12/24/2011 0355   BUN 15 12/24/2011 0355   CREATININE 0.55 12/24/2011 0355   CALCIUM 9.6 12/24/2011 0355   GFRNONAA >90 12/24/2011 0355   GFRAA >90 12/24/2011 0355     ASSESSMENT/PLAN:  SAH s/p Ventricular drain.  Intermittently has severe headaches. -Drain per neurosurgery, plan is for it to stay in until after repeat cerebral angio on 5/13 -goal SBP 120 to 140>>continue IV fluid -continue nimodipine and monitor for vasospasm>>f/u TCD intermittently -keppra for anti-sz prophylaxis per neurosurgery -rocephin for post-drain prophylaxis -control headache>>prn tylenol, percocet, morphine -neurosurgery added decadron 5/9  Acute on chronic sinusitis -continue flonase bid -continue singulair, loratidine -saline irrigation bid and prn -D7/x rocephin, will likely continue until after ventric drain is out  Asthma with persistent cough -d/c'd pulmicort while she is getting decadron -continue albuterol bid and q4h prn  Cough likely from post-nasal drip -continue hycodan for cough suppression  Constipation likely from narcotic use -continue bowel regimen  Steroid induced hyperglycemia  Lab 12/24/11 0745 12/23/11 2158 12/23/11 1642 12/23/11 1241 12/22/11 2159  GLUCAP 190* 161* 119* 192* 185*  -change to CHO modified diet -if CBG's elevated may need to start SSI  Best practice -SCD for DVT prophylaxis -Protonix for SUP -HOB elevated  Levy Pupa, MD, PhD 12/24/2011, 10:53 AM [facility] [phone] or if no answer [phone]

## 2011-12-25 LAB — GLUCOSE, CAPILLARY
Glucose-Capillary: 115 mg/dL — ABNORMAL HIGH (ref 70–99)
Glucose-Capillary: 131 mg/dL — ABNORMAL HIGH (ref 70–99)
Glucose-Capillary: 274 mg/dL — ABNORMAL HIGH (ref 70–99)
Glucose-Capillary: 129 mg/dL — ABNORMAL HIGH (ref 70–99)

## 2011-12-25 MED ORDER — DIVALPROEX SODIUM 500 MG PO DR TAB
500.0000 mg | DELAYED_RELEASE_TABLET | Freq: Two times a day (BID) | ORAL | Status: AC
  Administered 2011-12-25 – 2012-01-05 (×24): 500 mg via ORAL
  Filled 2011-12-25 (×24): qty 1

## 2011-12-25 NOTE — Progress Notes (Signed)
Subjective: Patient awake and alert.  Still with headaches and photophobia. Denies Nausea/vomitting.   Objective: Vital signs in last 24 hours: Temp:  [97.7 F (36.5 C)-98.7 F (37.1 C)] 98.7 F (37.1 C) (05/12 0400) Pulse Rate:  [63-81] 71  (05/12 0900) Resp:  [11-19] 13  (05/12 0900) BP: (115-152)/(34-93) 137/65 mmHg (05/12 0900) SpO2:  [93 %-99 %] 96 % (05/12 0900) Weight:  [147 lb 7.8 oz (66.9 kg)] 147 lb 7.8 oz (66.9 kg) (05/12 0600)    Intake/Output from previous day: 05/11 0701 - 05/12 0700 In: 4898 [P.O.:1848; I.V.:3000; IV Piggyback:50] Out: 6029 [Urine:5745; Drains:284] Intake/Output this shift: Total I/O In: 350 [P.O.:100; I.V.:250] Out: 640 [Urine:600; Drains:40]  Physical examination :  Alert, oriented.  Speech clear and moving all extremities.  Drain intact with bloody fluid.  Lungs : CTA bilaterally.  CV: RRR, no murmurs rubs or gallops.  No LE edema noted.  Discussed with patient need for follow up angiogram planned for tomorrow am to assist with etiology of SAH. Patient and spouse are aware and are in agreement to proceed.   Lab Results:   Basename 12/23/11 0400  WBC 8.5  HGB 13.2  HCT 38.3  PLT 310   BMET  Basename 12/24/11 0355 12/23/11 0400  NA 135 134*  K 4.2 4.4  CL 98 99  CO2 27 25  GLUCOSE 163* 147*  BUN 15 9  CREATININE 0.55 0.47*  CALCIUM 9.6 9.7   Results for Danielle Riddle, Danielle Riddle (MRN [id number]) as of 12/25/2011 10:55  Ref. Range 09/19/2010 18:47 12/16/2011 02:59  Prothrombin Time Latest Range: 11.6-15.2 seconds 12.9 13.7  INR Latest Range: 0.00-1.49  0.95 1.03  aPTT Latest Range: 24-37 seconds  25   Ir Angio Intra Extracran Sel Com Carotid Innominate Bilat Mod Sed  12/22/2011 *RADIOLOGY REPORT* Clinical Data: Acute onset of headaches, nausea, vomiting. CT scan of the brain revealing subarachnoid hemorrhage. BILATERAL COMMON CAROTID AND BILATERAL VERTEBRAL ARTERY ANGIOGRAMS Comparison: CT of the brain of 12/16/2011. Following a full  explanation of the procedure along with the potential associated complications, an informed witnessed consent was obtained. The right groin was prepped and draped in the usual sterile fashion. Thereafter using modified Seldinger technique, transfemoral access into the right common femoral artery was obtained without difficulty. Over a 0.035-inch guidewire, a 5- [place] Pinnacle sheath was inserted. Through this and also over a 0.035-inch guidewire, a 5-French JB1 catheter was advanced to the aortic arch region and selectively positioned in the right common carotid artery, the right vertebral artery, the left common carotid artery and the left vertebral artery. There were no acute complications. The patient tolerated the procedure well. Medications utilized: Versed 4 mg IV. Dilaudid 2 mg IV. Fentanyl 100 mcg IV. Contrast: Omnipaque-300 approximately 60 ml. Findings: The right vertebral artery origin is normal. The vessel is seen to opacify normally to the cranial skull base. There is normal opacification of the right posterior-inferior cerebellar artery and the right vertebrobasilar junction. The opacified portion of the basilar artery, the posterior cerebral arteries, the superior cerebellar arteries and the anterior- inferior cerebellar arteries is normal into the capillary and venous phases. Unopacified blood is seen in the basilar artery from the contralateral vertebral artery. The right common carotid arteriogram demonstrates the right external carotid artery and its major branches to be normal. The right internal carotid artery at the bulb to the cranial skull base opacifies normally. The petrous, the cavernous and the supraclinoid segments are normal. The right middle and the right anterior  cerebral arteries opacify normally into the capillary and venous phases. The left common carotid arteriogram demonstrates the left external carotid artery and its major branches to be normal. The left internal carotid artery  at the bulb to the cranial skull base opacifies normally. The petrous, the cavernous and the supraclinoid segments are normal. The left middle and the left anterior cerebral arteries opacify normally into the capillary and venous phases. The left vertebral artery origin is normal. The vessel opacifies normally to the cranial skull base. There is normal opacification of the left posterior-inferior cerebellar artery and the left vertebrobasilar junction. The basilar artery, the posterior cerebral arteries, the superior cerebellar arteries and the anterior-inferior cerebellar arteries opacify normally into the capillary and venous phases. Unopacified blood is seen in the basilar artery from the contralateral vertebral artery. IMPRESSION: 1. Angiographically no evidence of an aneurysm, intracranial dissection, stenosis, arteriovenous malformation or of dural AV fistula. 2. Venous return within normal limits. Original Report Authenticated By: Oneal Grout, M.D.   Studies/Results: Ct Head Wo Contrast  12/24/2011  *RADIOLOGY REPORT*  Clinical Data: Worsening headache.  CT HEAD WITHOUT CONTRAST  Technique:  Contiguous axial images were obtained from the base of the skull through the vertex without contrast.  Comparison: CT head without contrast 12/21/2011.  Findings: The right frontal ventriculostomy catheters stable in position.  The ventricular size is reduced.  The interventricular hemorrhage is again noted bilaterally.  Subarachnoid or subdural blood over the tentorium is slightly less conspicuous than on the prior exams. Posterior bilateral subarachnoid hemorrhage is similar to the prior exams.  No new hemorrhage is present.  No acute infarct or mass lesion is present.  The fluid levels are again noted within the in the maxillary and sphenoid sinuses bilaterally, slightly less prominent than on the prior study.  There is partial opacification of the ethmoid air cells.  The patient is status post nasal  surgery.  Mild mucosal thickening is noted along the inferior frontal sinuses, improved. The mastoid air cells are clear.  Apart from the burr hole, the osseous skull is intact.  IMPRESSION:  1.  Continued reduction in size of the ventricles.  The ventriculostomy catheter is stable. 2.  Persistent subarachnoid and intraventricular hemorrhage. 3.  No new hemorrhage. 4.  Improving sinus disease.  Original Report Authenticated By: Jamesetta Orleans. MATTERN, M.D.    Anti-infectives: Anti-infectives     Start     Dose/Rate Route Frequency Ordered Stop   12/17/11 1400   cefTRIAXone (ROCEPHIN) 1 g in dextrose 5 % 50 mL IVPB        1 g 100 mL/hr over 30 Minutes Intravenous Every 24 hours 12/17/11 1302     12/16/11 0400   azithromycin (ZITHROMAX) 500 mg in dextrose 5 % 250 mL IVPB        500 mg 250 mL/hr over 60 Minutes Intravenous Every 24 hours 12/16/11 0313 12/17/11 0506          Assessment/Plan:  SAH of uncertain etiology.  Patient with ventric drain which continues to drain bloody fluid.  Patient has been examined as well as her labs and she is stable for repeat angiogram in the am.      LOS: 9 days    Finbar Nippert D 12/25/2011

## 2011-12-25 NOTE — Progress Notes (Signed)
Subjective: Patient sitting up in bed, continued to complain of varying amounts of headache and neck pain. At times they're worse, and at other times there not as bad. IVC continued to drain bloody CSF.  Objective: Vital signs in last 24 hours: Filed Vitals:   12/25/11 0200 12/25/11 0400 12/25/11 0600 12/25/11 0800  BP: 119/67 152/74 120/59 127/67  Pulse: 63 72 70 65  Temp:  98.7 F (37.1 C)    TempSrc:  Oral    Resp: 12 14 14 19   Height:      Weight:   66.9 kg (147 lb 7.8 oz)   SpO2: 94% 97% 95% 93%    Intake/Output from previous day: 05/11 0701 - 05/12 0700 In: 4898 [P.O.:1848; I.V.:3000; IV Piggyback:50] Out: 6029 [Urine:5745; Drains:284] Intake/Output this shift: Total I/O In: 125 [I.V.:125] Out: 610 [Urine:600; Drains:10]  Physical Exam:  Patient is awake and alert, oriented. Following commands. Speech is fluent. 1 to 2+ meningismus. Moving all 4 foot as well.  CBC  Basename 12/23/11 0400  WBC 8.5  HGB 13.2  HCT 38.3  PLT 310   BMET  Basename 12/24/11 0355 12/23/11 0400  NA 135 134*  K 4.2 4.4  CL 98 99  CO2 27 25  GLUCOSE 163* 147*  BUN 15 9  CREATININE 0.55 0.47*  CALCIUM 9.6 9.7    Studies/Results: Ct Head Wo Contrast  12/24/2011  *RADIOLOGY REPORT*  Clinical Data: Worsening headache.  CT HEAD WITHOUT CONTRAST  Technique:  Contiguous axial images were obtained from the base of the skull through the vertex without contrast.  Comparison: CT head without contrast 12/21/2011.  Findings: The right frontal ventriculostomy catheters stable in position.  The ventricular size is reduced.  The interventricular hemorrhage is again noted bilaterally.  Subarachnoid or subdural blood over the tentorium is slightly less conspicuous than on the prior exams. Posterior bilateral subarachnoid hemorrhage is similar to the prior exams.  No new hemorrhage is present.  No acute infarct or mass lesion is present.  The fluid levels are again noted within the in the maxillary and  sphenoid sinuses bilaterally, slightly less prominent than on the prior study.  There is partial opacification of the ethmoid air cells.  The patient is status post nasal surgery.  Mild mucosal thickening is noted along the inferior frontal sinuses, improved. The mastoid air cells are clear.  Apart from the burr hole, the osseous skull is intact.  IMPRESSION:  1.  Continued reduction in size of the ventricles.  The ventriculostomy catheter is stable. 2.  Persistent subarachnoid and intraventricular hemorrhage. 3.  No new hemorrhage. 4.  Improving sinus disease.  Original Report Authenticated By: Jamesetta Orleans. MATTERN, M.D.    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 We'll plan to continuing her Decadron. We'll begin out of bed to chair, but we'll have IVC continue to drain while sitting up in chair. For followup cerebral angiogram (diagnostic) tomorrow. I've discussed her case with Dr. Delia Heady, the director the stroke service. He suggested trying Depakote for her headaches, and I've ordered 500 mg every 12 hours by mouth. If the Depakote helps, and she tolerates it well, we'll plan on discontinuing the Keppra (since that was prescribed solely for seizure prophylaxis, and the Depakote would function in that way as well). We'll plan on continuing current treatment otherwise.   Hewitt Shorts, MD 12/25/2011, 8:50 AM

## 2011-12-25 NOTE — Progress Notes (Signed)
Danielle Riddle is a 66 y.o. female admitted on 12/16/2011 with headache, n/v, photophobia, and confusion.  Found to have SAH.  Had tx for asthma exacerbation, and sinusitis prior to admit (Followed by Dr. Shelle Iron as outpt).  PCCM consulted 5/3 for medical management while in ICU.  Line/tube: Rt ventricular drain 5/3>>   Cultures: MRSA screen 5/3>>negative  Antibiotics: Zithromax 5/1>>5/4 Rocephin 5/4>>  Tests/events: 5/3 CT head>>SAH, no hydrocephalus 5/5 CT head>>decrease in [facility], no change in IVH, acute on chronic pansinusitis  SUBJECTIVE: Planning for repeat cerebral angio on 5/13, the drain will stay in until then Still w some cough -- causes terrible HA  OBJECTIVE:  Blood pressure 124/66, pulse 62, temperature 98.6 F (37 C), temperature source Oral, resp. rate 13, height 5\' 6"  (1.676 m), weight 66.9 kg (147 lb 7.8 oz), SpO2 94.00%. Wt Readings from Last 3 Encounters:  12/25/11 66.9 kg (147 lb 7.8 oz)  12/14/11 64.955 kg (143 lb 3.2 oz)  09/20/11 65.046 kg (143 lb 6.4 oz)   Body mass index is 23.81 kg/(m^2).  I/O last 3 completed shifts: In: 7368 [P.O.:2568; I.V.:4750; IV Piggyback:50] Out: 7829 [FAOZH:0865; Drains:416]  Physical Exam: General - no distress HEENT - clear nasal drainage Cardiac - s1s2 regular Chest - good air entry, no wheeze/rales Abd - soft, nontender Ext - no edema Neuro - sleepy, normal strength Psych - normal mood, behavior  CBC    Component Value Date/Time   WBC 8.5 12/23/2011 0400   RBC 4.19 12/23/2011 0400   HGB 13.2 12/23/2011 0400   HCT 38.3 12/23/2011 0400   PLT 310 12/23/2011 0400   MCV 91.4 12/23/2011 0400   MCH 31.5 12/23/2011 0400   MCHC 34.5 12/23/2011 0400   RDW 13.0 12/23/2011 0400   LYMPHSABS 1.0 12/16/2011 0259   MONOABS 0.8 12/16/2011 0259   EOSABS 0.0 12/16/2011 0259   BASOSABS 0.0 12/16/2011 0259    BMET    Component Value Date/Time   NA 135 12/24/2011 0355   K 4.2 12/24/2011 0355   CL 98 12/24/2011 0355   CO2 27 12/24/2011  0355   GLUCOSE 163* 12/24/2011 0355   BUN 15 12/24/2011 0355   CREATININE 0.55 12/24/2011 0355   CALCIUM 9.6 12/24/2011 0355   GFRNONAA >90 12/24/2011 0355   GFRAA >90 12/24/2011 0355     ASSESSMENT/PLAN:  SAH s/p Ventricular drain.  Intermittently has severe headaches. -Drain per neurosurgery, plan is for it to stay in until after repeat cerebral angio on 5/13 -goal SBP 120 to 140>>continue IV fluid -continue nimodipine and monitor for vasospasm>>f/u TCD intermittently -keppra for anti-sz prophylaxis per neurosurgery -rocephin for post-drain prophylaxis -control headache>>prn tylenol, percocet, morphine -neurosurgery added decadron 5/9  Acute on chronic sinusitis -continue flonase bid -continue singulair, loratidine -saline irrigation bid and prn -D8/x rocephin, will likely continue until after ventric drain is out  Asthma with persistent cough -d/c'd pulmicort while she is getting decadron -continue albuterol bid and q4h prn  Cough likely from post-nasal drip -continue hycodan for cough suppression  Constipation likely from narcotic use -continue bowel regimen  Steroid induced hyperglycemia  Lab 12/24/11 2154 12/24/11 1732 12/24/11 1119 12/24/11 0745 12/23/11 2158  GLUCAP 153* 135* 115* 190* 161*  -CHO modified diet -if CBG's increase may need to start SSI  Best practice -SCD for DVT prophylaxis -Protonix for SUP -HOB elevated  Levy Pupa, MD, PhD 12/25/2011, 11:43 AM [facility] [phone] or if no answer [phone]

## 2011-12-26 ENCOUNTER — Ambulatory Visit (HOSPITAL_COMMUNITY)
Admission: EM | Admit: 2011-12-26 | Payer: PRIVATE HEALTH INSURANCE | Source: Other Acute Inpatient Hospital | Admitting: Neurosurgery

## 2011-12-26 ENCOUNTER — Inpatient Hospital Stay (HOSPITAL_COMMUNITY): Payer: PRIVATE HEALTH INSURANCE

## 2011-12-26 DIAGNOSIS — I609 Nontraumatic subarachnoid hemorrhage, unspecified: Secondary | ICD-10-CM

## 2011-12-26 LAB — CBC
Platelets: 371 10*3/uL (ref 150–400)
RDW: 13.3 % (ref 11.5–15.5)
WBC: 8.3 10*3/uL (ref 4.0–10.5)

## 2011-12-26 LAB — GLUCOSE, CAPILLARY: Glucose-Capillary: 130 mg/dL — ABNORMAL HIGH (ref 70–99)

## 2011-12-26 LAB — BASIC METABOLIC PANEL
Calcium: 9.5 mg/dL (ref 8.4–10.5)
Creatinine, Ser: 0.46 mg/dL — ABNORMAL LOW (ref 0.50–1.10)
GFR calc Af Amer: >90 mL/min (ref >90)
Sodium: 134 mEq/L — ABNORMAL LOW (ref 135–145)

## 2011-12-26 MED ORDER — DEXAMETHASONE SODIUM PHOSPHATE 4 MG/ML IJ SOLN
4.0000 mg | Freq: Once | INTRAMUSCULAR | Status: AC
  Administered 2011-12-26: 4 mg via INTRAVENOUS

## 2011-12-26 MED ORDER — POTASSIUM CHLORIDE IN NACL 20-0.9 MEQ/L-% IV SOLN
INTRAVENOUS | Status: DC
  Administered 2011-12-26 – 2011-12-27 (×3): via INTRAVENOUS
  Filled 2011-12-26 (×5): qty 1000

## 2011-12-26 MED ORDER — FENTANYL CITRATE 0.05 MG/ML IJ SOLN
INTRAMUSCULAR | Status: AC | PRN
  Administered 2011-12-26 (×2): 25 ug via INTRAVENOUS

## 2011-12-26 MED ORDER — MIDAZOLAM HCL 5 MG/5ML IJ SOLN
INTRAMUSCULAR | Status: AC | PRN
  Administered 2011-12-26: 1 mg via INTRAVENOUS

## 2011-12-26 MED ORDER — IOHEXOL 300 MG/ML  SOLN
200.0000 mL | Freq: Once | INTRAMUSCULAR | Status: AC | PRN
  Administered 2011-12-26: 85 mL via INTRA_ARTERIAL

## 2011-12-26 NOTE — Progress Notes (Signed)
VASCULAR LAB PRELIMINARY  PRELIMINARY  PRELIMINARY  PRELIMINARY  TCD completed.    Preliminary report:  Results reported with those of 12/16/2011  Toma Deiters D, RVS 12/26/2011, 6:13 PM

## 2011-12-26 NOTE — ED Notes (Signed)
NS with 40 meq K replaced with NS for duration of procedure per radiology protocall

## 2011-12-26 NOTE — ED Notes (Signed)
Pt complain of severe headache.  Fentanyl 25 mcg IV given per MD instructions

## 2011-12-26 NOTE — Progress Notes (Signed)
Subjective: Still having some headache, as well as neck discomfort, but overall still significantly improved as compared to last week. IVC continues to drain well. CSF remains blood-tinged but also with some background xanthochromia. For followup cerebral angiogram this morning.  Objective: Vital signs in last 24 hours: Filed Vitals:   12/26/11 0100 12/26/11 0300 12/26/11 0400 12/26/11 0500  BP: 117/60 112/63  124/64  Pulse: 61 67 60   Temp:   97.4 F (36.3 C)   TempSrc:   Oral   Resp: 11 12 11 12   Height:      Weight:      SpO2: 96% 96% 96%     Intake/Output from previous day: 05/12 0701 - 05/13 0700 In: 4892.5 [P.O.:2030; I.V.:2812.5; IV Piggyback:50] Out: 1610 [RUEAV:4098; Drains:321] Intake/Output this shift:    Physical Exam:  Awake and alert, oriented. Pupils 2.5 mm bilaterally equal round and sluggishly reactive to light. A truck of movements intact. Facial movements symmetrical. 1-2+ meningismus. No drift of upper extremities.  CBC  Basename 12/26/11 0445  WBC 8.3  HGB 13.7  HCT 40.2  PLT 371   BMET  Basename 12/26/11 0445 12/24/11 0355  NA 134* 135  K 4.5 4.2  CL 98 98  CO2 26 27  GLUCOSE 144* 163*  BUN 13 15  CREATININE 0.46* 0.55  CALCIUM 9.5 9.6    Assessment/Plan: Continue IV fluids, Nimotop, Decadron, TCDs. For followup cerebral angiogram today. Have spoken with the patient and her husband each morning about her condition and her plans for treatment and care, their questions have been answered. We'll change IVF to normal saline with 20 of KCl per liter (from 40 of KCl per liter).   Hewitt Shorts, MD 12/26/2011, 8:17 AM

## 2011-12-26 NOTE — Procedures (Signed)
S/P 4 vessel cerebral aretriogram REt CFA approach. Preliminary findings. 1. Mod-severe vasospasm both VBJs ., and to a lesser  degree supraclinoid ICAs 2No aneurysms,AVMs or DAVF. 3, Venous outflow WNLs.

## 2011-12-26 NOTE — Progress Notes (Addendum)
Danielle Riddle is a 66 y.o. female admitted on 12/16/2011 with headache, n/v, photophobia, and confusion.  Found to have SAH.  Had tx for asthma exacerbation, and sinusitis prior to admit (Followed by Dr. Shelle Iron as outpt).  PCCM consulted 5/3 for medical management while in ICU.  Line/tube: Rt ventricular drain 5/3>>   Cultures: MRSA screen 5/3>>negative  Antibiotics: Zithromax 5/1>>5/4 Rocephin 5/4>>  Tests/events: 5/3 CT head>>SAH, no hydrocephalus 5/5 CT head>>decrease in [facility], no change in IVH, acute on chronic pansinusitis 5/12 CT head>>>  SUBJECTIVE: Cough better, primary c/o is head ache.   OBJECTIVE:  Blood pressure 124/64, pulse 60, temperature 97.4 F (36.3 C), temperature source Oral, resp. rate 12, height 5\' 6"  (1.676 m), weight 66.9 kg (147 lb 7.8 oz), SpO2 96.00%. Wt Readings from Last 3 Encounters:  12/25/11 66.9 kg (147 lb 7.8 oz)  12/14/11 64.955 kg (143 lb 3.2 oz)  09/20/11 65.046 kg (143 lb 6.4 oz)   Body mass index is 23.81 kg/(m^2).  I/O last 3 completed shifts: In: 7352.5 [P.O.:2990; I.V.:4312.5; IV Piggyback:50] Out: 76195 [Urine:11025; Drains:472]  Physical Exam: General - no distress HEENT - no changes in discharge, Right IVC draining clear CSF Cardiac - s1s2 regular Chest - good air entry, no wheezing Abd - soft, nontender Ext - no edema Neuro - awake, normal strength   Lab 12/26/11 0445 12/24/11 0355 12/23/11 0400  NA 134* 135 134*  K 4.5 4.2 4.4  CL 98 98 99  CO2 26 27 25   BUN 13 15 9   CREATININE 0.46* 0.55 0.47*  GLUCOSE 144* 163* 147*    Lab 12/26/11 0445 12/23/11 0400 12/21/11 0409  HGB 13.7 13.2 12.0  HCT 40.2 38.3 34.1*  WBC 8.3 8.5 7.5  PLT 371 310 245    ASSESSMENT/PLAN:  SAH s/p Ventricular drain.  Intermittently has severe headaches. plan -Drain per neurosurgery, plan is for it to stay in until after repeat cerebral angio on 5/13 -HHH MAP goals -continue nimodipine, goal 21 days f/u TCD today -keppra for anti-sz  prophylaxis per neurosurgery -rocephin for post-drain prophylaxis, consider dc for this purpose -control headache>>prn tylenol, percocet, morphine -neurosurgery added decadron 5/9, limit when able Depakote noted on  MAR, can we dc keppra?  Hyponatremia -mild, at risk CSW Plan: assess urine na  Normokalemia -some caution with K in bag, bmet in am -low threshold to dc K in bag  Acute on chronic sinusitis -continue flonase bid -continue singulair, loratidine -saline irrigation bid and prn -rocephin, will likely continue until after ventric drain is out, would like to add coronal sinus exam to a follow up CT head This may change duration abx  Asthma with persistent cough -d/c'd pulmicort while she is getting decadron -continue albuterol bid and q4h prn -last pcxr Left base haziness, re asses on pcxr in am  -Cough likely from post-nasal drip -continue hycodan for cough suppression -would add coronal exam sinus to a follow up ct head at some point  Constipation likely from narcotic use -continue bowel regimen -no BM noted, charted will ask pateint  Steroid induced hyperglycemia  Lab 12/25/11 2128 12/25/11 1753 12/25/11 1135 12/25/11 0815 12/24/11 2154  GLUCAP 129* 274* 131* 115* 153*  -CHO modified diet -if CBG's increase may need to start SSI  Best practice -SCD for DVT prophylaxis -Protonix for SUP -HOB elevated  Anders Simmonds ACNP-BC [facility] Pager # [phone] OR # [phone] if no answer  Mcarthur Rossetti. Tyson Alias, MD, FACP Pgr: [phone] [facility]

## 2011-12-27 ENCOUNTER — Inpatient Hospital Stay (HOSPITAL_COMMUNITY): Payer: PRIVATE HEALTH INSURANCE

## 2011-12-27 DIAGNOSIS — I729 Aneurysm of unspecified site: Secondary | ICD-10-CM

## 2011-12-27 LAB — BASIC METABOLIC PANEL
CO2: 27 mEq/L (ref 19–32)
CO2: 27 mEq/L (ref 19–32)
Calcium: 9.3 mg/dL (ref 8.4–10.5)
Calcium: 9 mg/dL (ref 8.4–10.5)
Chloride: 96 mEq/L (ref 96–112)
Chloride: 96 mEq/L (ref 96–112)
Creatinine, Ser: 0.45 mg/dL — ABNORMAL LOW (ref 0.50–1.10)
GFR calc Af Amer: >90 mL/min (ref >90)
GFR calc non Af Amer: >90 mL/min (ref >90)
GFR calc non Af Amer: >90 mL/min (ref >90)
Glucose, Bld: 137 mg/dL — ABNORMAL HIGH (ref 70–99)
Glucose, Bld: 161 mg/dL — ABNORMAL HIGH (ref 70–99)
Potassium: 4 mEq/L (ref 3.5–5.1)
Potassium: 3.8 mEq/L (ref 3.5–5.1)
Sodium: 134 mEq/L — ABNORMAL LOW (ref 135–145)
Sodium: 135 mEq/L (ref 135–145)

## 2011-12-27 LAB — GLUCOSE, CAPILLARY: Glucose-Capillary: 144 mg/dL — ABNORMAL HIGH (ref 70–99)

## 2011-12-27 MED ORDER — SODIUM CHLORIDE 0.9 % IV SOLN
INTRAVENOUS | Status: DC
  Administered 2011-12-27 – 2011-12-31 (×16): via INTRAVENOUS

## 2011-12-27 MED ORDER — SODIUM CHLORIDE 0.9 % IV BOLUS (SEPSIS): 300.0000 mL | Freq: Once | INTRAVENOUS | Status: DC

## 2011-12-27 MED ORDER — ALBUMIN HUMAN 5 % IV SOLN
12.5000 g | Freq: Once | INTRAVENOUS | Status: AC
  Administered 2011-12-27: 12.5 g via INTRAVENOUS
  Filled 2011-12-27: qty 250

## 2011-12-27 MED ORDER — ALBUMIN HUMAN 5 % IV SOLN
12.5000 g | INTRAVENOUS | Status: DC
  Administered 2011-12-27 – 2011-12-31 (×20): 12.5 g via INTRAVENOUS
  Filled 2011-12-27 (×16): qty 250

## 2011-12-27 MED ORDER — BISACODYL 5 MG PO TBEC
5.0000 mg | DELAYED_RELEASE_TABLET | Freq: Every day | ORAL | Status: DC | PRN
  Filled 2011-12-27: qty 1

## 2011-12-27 MED ORDER — LIDOCAINE HCL (PF) 1 % IJ SOLN
INTRAMUSCULAR | Status: AC
  Administered 2011-12-27: 5 mL
  Filled 2011-12-27: qty 5

## 2011-12-27 MED ORDER — FLUDROCORTISONE ACETATE 0.1 MG PO TABS
0.1000 mg | ORAL_TABLET | Freq: Every day | ORAL | Status: DC
  Administered 2011-12-27 – 2011-12-29 (×3): 0.1 mg via ORAL
  Filled 2011-12-27 (×4): qty 1

## 2011-12-27 NOTE — Progress Notes (Signed)
Subjective: Patient complaining of severe headache, more so than neck discomfort. IVC continued to drain well. Preliminary verbal report from Dr. Link Snuffer describes no aneurysm seen, but vasospasm involving the vertebrobasilar system. I've reviewed the images and there is vasospasm involving the intracranial vertebral segments, but also possibly the anterior cerebrals as well. TCDs do show increased vertebral velocity.  Objective: Vital signs in last 24 hours: Filed Vitals:   12/27/11 0400 12/27/11 0500 12/27/11 0600 12/27/11 0700  BP: 124/71 135/65 126/63 139/65  Pulse: 63 82 65 69  Temp: 97.7 F (36.5 C)     TempSrc: Oral     Resp: 14 15 15 14   Height:      Weight:      SpO2: 95% 98% 96% 99%    Intake/Output from previous day: 05/13 0701 - 05/14 0700 In: 3192 [P.O.:240; I.V.:2900; IV Piggyback:52] Out: 6508 [Urine:6160; Drains:348] Intake/Output this shift:    Physical Exam:  Awake and alert, crying with pain. Oriented. Pupils 2 mm, round and reactive to light. Extraocular movements intact. Following commands. Face symmetrical. Moving all 4 extremity as well. 2+ meningismus.  CBC  Basename 12/26/11 0445  WBC 8.3  HGB 13.7  HCT 40.2  PLT 371   BMET  Basename 12/27/11 0500 12/26/11 0445  NA 134* 134*  K 4.2 4.5  CL 96 98  CO2 27 26  GLUCOSE 137* 144*  BUN 15 13  CREATININE 0.45* 0.46*  CALCIUM 9.3 9.5    Studies/Results: No results found.  Assessment/Plan: We'll give bolus of 250 cc (12.5 g) of albumin over one hour and then to begin 40 cc per hour continuous infusion. Results of angiomata discussed with the patient and her husband. We'll need to continue to treat her vasospasm, she will continue to require drainage from her IVC. Eventually she'll need further workup with CTA and/or MRA, and probably another catheter angiogram in a few weeks. Did discuss risks of vasospasm including brain ischemia, and the inherent risks associated with that.   Hewitt Shorts, MD 12/27/2011, 7:48 AM

## 2011-12-27 NOTE — Progress Notes (Addendum)
  Subjective: [facility] 12/15/11 Cerebral arteriogram 5/3: no intervention; no avm/avf/aneurysm Repeat arteriogram 5/13: same result; although vasospasm noted vertebrobasilar arteries Still with headache In process of having central line placed by CCM    Objective: Vital signs in last 24 hours: Temp:  [97.7 F (36.5 C)-98.6 F (37 C)] 98.4 F (36.9 C) (05/14 0700) Pulse Rate:  [58-82] 76  (05/14 0900) Resp:  [10-18] 15  (05/14 0900) BP: (104-139)/(49-100) 135/62 mmHg (05/14 0900) SpO2:  [93 %-100 %] 100 % (05/14 0900) Last BM Date:  (Prior to admission)  Intake/Output from previous day: 05/13 0701 - 05/14 0700 In: 3192 [P.O.:240; I.V.:2900; IV Piggyback:52] Out: 6508 [Urine:6160; Drains:348] Intake/Output this shift: Total I/O In: 500 [I.V.:250; IV Piggyback:250] Out: 133 [Urine:125; Drains:8]  PE:  Vss; afeb Moves all 4s Holding head bc of headache ventric in place; draining well; 350 cc 5/13- pinkish color Rt groin nt; no bleeding; no hematoma 2+ pulse at rt foot  Lab Results:   [facility] 12/26/11 0445  WBC 8.3  HGB 13.7  HCT 40.2  PLT 371   BMET  Basename 12/27/11 0500 12/26/11 0445  NA 134* 134*  K 4.2 4.5  CL 96 98  CO2 27 26  GLUCOSE 137* 144*  BUN 15 13  CREATININE 0.45* 0.46*  CALCIUM 9.3 9.5   PT/INR No results found for this basename: LABPROT:2,INR:2 in the last 72 hours ABG No results found for this basename: PHART:2,PCO2:2,PO2:2,HCO3:2 in the last 72 hours  Studies/Results: Dg Chest Port 1 View  12/27/2011  *RADIOLOGY REPORT*  Clinical Data: Follow up left base opacity  PORTABLE CHEST - 1 VIEW  Comparison: 12/16/2011  Findings:  The heart size appears normal.  Asymmetric opacification within the left midlung and left base is identified.  This is not significantly changed from the previous exam.  Plate-like atelectasis is noted within the perihilar right midlung.  IMPRESSION:  1.  Left perihilar opacity which is unchanged from previous study. 2.   New plate-like atelectasis within the right midlung.  Original Report Authenticated By: Rosealee Albee, M.D.    Anti-infectives: Anti-infectives     Start     Dose/Rate Route Frequency Ordered Stop   12/17/11 1400   cefTRIAXone (ROCEPHIN) 1 g in dextrose 5 % 50 mL IVPB        1 g 100 mL/hr over 30 Minutes Intravenous Every 24 hours 12/17/11 1302     12/16/11 0400   azithromycin (ZITHROMAX) 500 mg in dextrose 5 % 250 mL IVPB        500 mg 250 mL/hr over 60 Minutes Intravenous Every 24 hours 12/16/11 0313 12/17/11 0506          Assessment/Plan: s/p SAH 12/16/11; no intervention Re cerebral arteriogram 5/13 shows no aneurysm; no avm/avf; but does show vasospasm Vertebrobasilar arteries Getting central line now with CCM follow as needed  Teighlor Korson A 12/27/2011

## 2011-12-27 NOTE — Progress Notes (Signed)
Danielle Riddle is a 66 y.o. female admitted on 12/16/2011 with headache, n/v, photophobia, and confusion.  Found to have SAH.  Had tx for asthma exacerbation, and sinusitis prior to admit (Followed by Dr. Shelle Iron as outpt).  PCCM consulted 5/3 for medical management while in ICU.  Line/tube: Rt ventricular drain 5/3>>   Cultures: MRSA screen 5/3>>negative  Antibiotics: Zithromax 5/1>>5/4 Rocephin 5/4>>  Tests/events: 5/3 CT head>>SAH, no hydrocephalus 5/5 CT head>>decrease in [facility], no change in IVH, acute on chronic pansinusitis 5/12 CT head>>>evidence vasospasm  SUBJECTIVE: head ache.   OBJECTIVE:  Blood pressure 128/66, pulse 66, temperature 98.4 F (36.9 C), temperature source Oral, resp. rate 10, height 5\' 6"  (1.676 m), weight 66.9 kg (147 lb 7.8 oz), SpO2 97.00%. Wt Readings from Last 3 Encounters:  12/25/11 66.9 kg (147 lb 7.8 oz)  12/14/11 64.955 kg (143 lb 3.2 oz)  09/20/11 65.046 kg (143 lb 6.4 oz)   Body mass index is 23.81 kg/(m^2).  I/O last 3 completed shifts: In: 5892 [P.O.:1440; I.V.:4400; IV Piggyback:52] Out: 9949 [Urine:9460; Drains:489]  Physical Exam: General - no distress HEENT - Right IVC draining clear CSF, perrl Cardiac - s1s2 regular Chest - good air entry, no wheezing Abd - soft, nontender Ext - no edema Neuro - awake, normal strength   Lab 12/27/11 0500 12/26/11 0445 12/24/11 0355  NA 134* 134* 135  K 4.2 4.5 4.2  CL 96 98 98  CO2 27 26 27   BUN 15 13 15   CREATININE 0.45* 0.46* 0.55  GLUCOSE 137* 144* 163*    Lab 12/26/11 0445 12/23/11 0400 12/21/11 0409  HGB 13.7 13.2 12.0  HCT 40.2 38.3 34.1*  WBC 8.3 8.5 7.5  PLT 371 310 245    ASSESSMENT/PLAN:  SAH s/p Ventricular drain.  Intermittently has severe headaches. plan -HHH MAP goals are even more important, consider fluids to be 50 cc/hr greater than current output -continue nimodipine, goal 21 days f/u TCD, reviewed -keppra for anti-sz prophylaxis per neurosurgery -rocephin   consider dc for this purpose -control headache>>prn tylenol, percocet, morphine -neurosurgery added decadron 5/9, limit when able Depakote noted on  MAR, can we dc keppra?  -with CT A findings and neg 3 liters, I agree albumin etc, but would increase maintenance even further to be pos 2 liters today as goal -I feel she needs CVl, cvp now -role papaverine?, will d/w NS -after line would push MAP to goal 80 with pressors if needed, currently on own at this goal  Hyponatremia -\CSW likely, assess urine na - 117 Plan: This is likely CSW, consider addition STATIN vs florinef, match to pos balance, follow chem in am   Normokalemia -some caution with K in bag, bmet in am -low threshold to dc K in bag  Acute on chronic sinusitis -continue flonase bid -continue loratidine Dc singulair -saline irrigation bid and prn -would like to add coronal sinus exam to a follow up CT head  Asthma with persistent cough -d/c'd pulmicort while she is getting decadron -continue albuterol bid and q4h prn -pcxr in am for fluid status -continue hycodan for cough suppression -would add coronal exam sinus to a follow up ct head at some point  Constipation likely from narcotic use -continue bowel regimen -no BM noted, failing senna, add dulx oral?  Steroid induced hyperglycemia  Lab 12/27/11 0801 12/26/11 2041 12/26/11 1751 12/26/11 1202 12/25/11 2128  GLUCAP 144* 105* 219* 130* 129*  -CHO modified diet -if CBG's increase may need to start SSI  Best practice -SCD for DVT prophylaxis -Protonix for SUP -HOB elevated  Ccm 30 min   Mcarthur Rossetti. Tyson Alias, MD, FACP Pgr: [phone] [facility]

## 2011-12-27 NOTE — Procedures (Signed)
Central Venous Catheter Insertion Procedure Note Danielle Riddle [id number] [birth year]  Procedure: Insertion of Central Venous Catheter Indications: Assessment of intravascular volume  Procedure Details Consent: Risks of procedure as well as the alternatives and risks of each were explained to the (patient/caregiver).  Consent for procedure obtained. Time Out: Verified patient identification, verified procedure, site/side was marked, verified correct patient position, special equipment/implants available, medications/allergies/relevent history reviewed, required imaging and test results available.  Performed  Maximum sterile technique was used including antiseptics, cap, gloves, gown, hand hygiene, mask and sheet. Skin prep: Chlorhexidine; local anesthetic administered A antimicrobial bonded/coated triple lumen catheter was placed in the right internal jugular vein using the Seldinger technique.  Evaluation Blood flow good Complications: No apparent complications Patient did tolerate procedure well. Chest X-ray ordered to verify placement.  CXR: pending.  Nelda Bucks. 12/27/2011, 10:09 AM  [place] Tolerated well

## 2011-12-28 ENCOUNTER — Inpatient Hospital Stay (HOSPITAL_COMMUNITY): Payer: PRIVATE HEALTH INSURANCE

## 2011-12-28 DIAGNOSIS — I6789 Other cerebrovascular disease: Secondary | ICD-10-CM

## 2011-12-28 DIAGNOSIS — J329 Chronic sinusitis, unspecified: Secondary | ICD-10-CM

## 2011-12-28 DIAGNOSIS — I609 Nontraumatic subarachnoid hemorrhage, unspecified: Secondary | ICD-10-CM

## 2011-12-28 LAB — SODIUM, URINE, RANDOM: Sodium, Ur: 111 mEq/L

## 2011-12-28 LAB — BASIC METABOLIC PANEL
BUN: 9 mg/dL (ref 6–23)
CO2: 28 mEq/L (ref 19–32)
Calcium: 9.1 mg/dL (ref 8.4–10.5)
Calcium: 9 mg/dL (ref 8.4–10.5)
Creatinine, Ser: 0.42 mg/dL — ABNORMAL LOW (ref 0.50–1.10)
GFR calc non Af Amer: >90 mL/min (ref >90)
GFR calc non Af Amer: >90 mL/min (ref >90)
Glucose, Bld: 152 mg/dL — ABNORMAL HIGH (ref 70–99)
Sodium: 136 mEq/L (ref 135–145)

## 2011-12-28 LAB — DIFFERENTIAL
Basophils Absolute: 0 10*3/uL (ref 0.0–0.1)
Eosinophils Absolute: 0 10*3/uL (ref 0.0–0.7)
Eosinophils Relative: 0 % (ref 0–5)
Lymphocytes Relative: 6 % — ABNORMAL LOW (ref 12–46)
Monocytes Absolute: 0.2 10*3/uL (ref 0.1–1.0)

## 2011-12-28 LAB — CBC
HCT: 36 % (ref 36.0–46.0)
MCH: 31.6 pg (ref 26.0–34.0)
MCHC: 33.9 g/dL (ref 30.0–36.0)
MCV: 93.3 fL (ref 78.0–100.0)
RDW: 13.1 % (ref 11.5–15.5)
WBC: 7.3 10*3/uL (ref 4.0–10.5)

## 2011-12-28 LAB — GLUCOSE, CAPILLARY
Glucose-Capillary: 129 mg/dL — ABNORMAL HIGH (ref 70–99)
Glucose-Capillary: 170 mg/dL — ABNORMAL HIGH (ref 70–99)
Glucose-Capillary: 179 mg/dL — ABNORMAL HIGH (ref 70–99)
Glucose-Capillary: 176 mg/dL — ABNORMAL HIGH (ref 70–99)
Glucose-Capillary: 139 mg/dL — ABNORMAL HIGH (ref 70–99)

## 2011-12-28 LAB — URINALYSIS, ROUTINE W REFLEX MICROSCOPIC
Glucose, UA: NEGATIVE mg/dL
Hgb urine dipstick: NEGATIVE
Specific Gravity, Urine: 1.008 (ref 1.005–1.030)

## 2011-12-28 LAB — COMPREHENSIVE METABOLIC PANEL
ALT: 8 U/L (ref 0–35)
AST: 11 U/L (ref 0–37)
Albumin: 3.4 g/dL — ABNORMAL LOW (ref 3.5–5.2)
Alkaline Phosphatase: 37 U/L — ABNORMAL LOW (ref 39–117)
Chloride: 98 mEq/L (ref 96–112)
Potassium: 4.1 mEq/L (ref 3.5–5.1)
Sodium: 134 mEq/L — ABNORMAL LOW (ref 135–145)
Total Protein: 6.1 g/dL (ref 6.0–8.3)

## 2011-12-28 MED ORDER — SIMVASTATIN 80 MG PO TABS
80.0000 mg | ORAL_TABLET | Freq: Every day | ORAL | Status: DC
  Administered 2011-12-28 – 2012-01-06 (×10): 80 mg via ORAL
  Filled 2011-12-28 (×11): qty 1

## 2011-12-28 MED ORDER — ALPRAZOLAM 0.5 MG PO TABS
0.5000 mg | ORAL_TABLET | Freq: Three times a day (TID) | ORAL | Status: DC
  Administered 2011-12-28 – 2012-01-02 (×16): 0.5 mg via ORAL
  Filled 2011-12-28 (×16): qty 1

## 2011-12-28 MED ORDER — POLYETHYLENE GLYCOL 3350 17 G PO PACK
17.0000 g | PACK | Freq: Once | ORAL | Status: AC
  Administered 2011-12-28: 17 g via ORAL
  Filled 2011-12-28: qty 1

## 2011-12-28 MED FILL — Perflutren Lipid Microsphere IV Susp 6.52 MG/ML: INTRAVENOUS | Qty: 2 | Status: AC

## 2011-12-28 NOTE — Progress Notes (Signed)
VASCULAR LAB PRELIMINARY  PRELIMINARY  PRELIMINARY  PRELIMINARY  TCD  completed.    Preliminary report:  Results found with those of 12/16/2011  Toma Deiters D, RVS 12/28/2011, 6:41 PM

## 2011-12-28 NOTE — Progress Notes (Signed)
Danielle Riddle is a 66 y.o. female admitted on 12/16/2011 with headache, n/v, photophobia, and confusion.  Found to have SAH.  Had tx for asthma exacerbation, and sinusitis prior to admit (Followed by Dr. Shelle Iron as outpt).  PCCM consulted 5/3 for medical management while in ICU.  Line/tube: Rt ventricular drain 5/3>>   Cultures: MRSA screen 5/3>>negative  Antibiotics: Zithromax 5/1>>5/4 Rocephin 5/4>>  Tests/events: 5/3 CT head>>SAH, no hydrocephalus 5/5 CT head>>decrease in [facility], no change in IVH, acute on chronic pansinusitis 5/12 CT head>>>evidence vasospasm 5/14- line placed, pos balance with fluid resuscitation, MAP met higher goals  SUBJECTIVE: 5/14- line placed, pos balance with fluid resuscitation, MAP met higher goals  OBJECTIVE:  Blood pressure 128/71, pulse 76, temperature 98 F (36.7 C), temperature source Oral, resp. rate 11, height 5\' 6"  (1.676 m), weight 74.7 kg (164 lb 10.9 oz), SpO2 93.00%. Wt Readings from Last 3 Encounters:  12/28/11 74.7 kg (164 lb 10.9 oz)  12/14/11 64.955 kg (143 lb 3.2 oz)  09/20/11 65.046 kg (143 lb 6.4 oz)   Body mass index is 26.58 kg/(m^2).  I/O last 3 completed shifts: In: 16109 [P.O.:2100; I.V.:7805; IV Piggyback:1182] Out: 60454 Iker.Keens; Drains:502]  Physical Exam: General - no distress HEENT - Right IVC draining clear CSF, perrl 3 mm Cardiac - s1s2 regular Chest - good air entry, no crackles Abd - soft, nontender, no r/g Ext - no edema at all Neuro - awake, normal strength   Lab 12/28/11 0150 12/27/11 1810 12/27/11 1132  NA 134* 135 134*  K 4.1 3.8 4.0  CL 98 98 96  CO2 29 27 27   BUN 11 12 14   CREATININE 0.49* 0.46* 0.47*  GLUCOSE 168* 178* 161*    Lab 12/28/11 0150 12/26/11 0445 12/23/11 0400  HGB 12.2 13.7 13.2  HCT 36.0 40.2 38.3  WBC 7.3 8.3 8.5  PLT 324 371 310    ASSESSMENT/PLAN:  SAH s/p Ventricular drain.  Intermittently has severe headaches. plan -HHH MAP goals to 70-75 -continue  nimodipine -keppra dc'ed -control headache>>prn tylenol, percocet, morphine, Depakote per neurology rec  -consider taper decadron -for tcd today -cvp goal 10 likely , will add additional work up for volume status however with increasing atx vs edema on pcxr May need top lower balance to 1l it pos with pcxr findings -role papaverine?, will d/w NS, pending tcd result -if needed would use levophed to MAP goa, not needed thus far  Hyponatremia -\CSW likely, assess urine na - 117 Plan: This is likely CSW Florinef May add statin, assess lft Re assess urine na  Acute on chronic sinusitis -continue flonase bid -continue loratidine -saline irrigation bid and prn -would like to add coronal sinus exam to a follow up CT head  Asthma with persistent cough -continue albuterol bid and q4h prn -pcxr in am for fluid status = edema? , lower rate fluids -continue hycodan for cough suppression  Constipation likely from narcotic use -continue bowel regimen -re dulx, may need lactulose, mrylax  Steroid induced hyperglycemia  Lab 12/27/11 1224 12/27/11 0801 12/26/11 2041 12/26/11 1751 12/26/11 1202  GLUCAP 187* 144* 105* 219* 130*  -CHO modified diet -if CBG's increase may need to start SSI  Best practice -SCD for DVT prophylaxis -Protonix for SUP -HOB elevated  Ccm 30 min   Mcarthur Rossetti. Tyson Alias, MD, FACP Pgr: [phone] [facility]

## 2011-12-28 NOTE — Progress Notes (Signed)
*  PRELIMINARY RESULTS* Echocardiogram 2D Echocardiogram has been performed.  Glean Salen [facility] 12/28/2011, 10:15 AM

## 2011-12-28 NOTE — Progress Notes (Signed)
Subjective:  Patient continued to have varying amounts of disabling headache. IVC continued to drain blood tinged xanthochromic CSF. Central line placed by CCM yesterday, CVP ranging between 4-6.  Objective: Vital signs in last 24 hours: Filed Vitals:   12/28/11 0600 12/28/11 0700 12/28/11 0749 12/28/11 0800  BP:  131/67  128/71  Pulse:  76    Temp:      TempSrc:      Resp:  17 10 11   Height:      Weight:      SpO2: 98% 93%      Intake/Output from previous day: 05/14 0701 - 05/15 0700 In: 9345 [P.O.:1860; I.V.:6305; IV Piggyback:1180] Out: 6501 [Urine:6200; Drains:301] Intake/Output this shift: Total I/O In: 340 [I.V.:300; IV Piggyback:40] Out: 370 [Urine:350; Drains:20]  Physical Exam:  Awake and alert, oriented. Following commands. Moving all 4 extremities well. 2+ meningismus.  CBC  Basename 12/28/11 0150 12/26/11 0445  WBC 7.3 8.3  HGB 12.2 13.7  HCT 36.0 40.2  PLT 324 371   BMET  Basename 12/28/11 0150 12/27/11 1810  NA 134* 135  K 4.1 3.8  CL 98 98  CO2 29 27  GLUCOSE 168* 178*  BUN 11 12  CREATININE 0.49* 0.46*  CALCIUM 9.0 9.0    Studies/Results: Dg Chest Port 1 View  12/28/2011  *RADIOLOGY REPORT*  Clinical Data: Edema.  PORTABLE CHEST - 1 VIEW  Comparison: 12/27/2011  Findings: Bilateral lower lobe airspace opacities, possibly edema. Heart is borderline in size.  Mild vascular congestion.  Right central line is unchanged.  Layering left pleural effusion.  IMPRESSION: Worsening bilateral lower lobe airspace opacities, question edema.  New left pleural effusion.  Original Report Authenticated By: Cyndie Chime, M.D.   Dg Chest Portable 1 View  12/27/2011  *RADIOLOGY REPORT*  Clinical Data: Central line placement  PORTABLE CHEST - 1 VIEW  Comparison: 12/27/2011  Findings: Cardiomediastinal silhouette is stable.  No acute infiltrate or pleural effusion.  No pulmonary edema.  Stable old right rib fractures.  Stable atelectasis right midlung.  There is a  right IJ central line with tip in SVC right atrium junction.  No diagnostic pneumothorax.  IMPRESSION: Right IJ central line with tip in SVC right atrium junction.  No diagnostic pneumothorax.  Original Report Authenticated By: Natasha Mead, M.D.   Dg Chest Port 1 View  12/27/2011  *RADIOLOGY REPORT*  Clinical Data: Follow up left base opacity  PORTABLE CHEST - 1 VIEW  Comparison: 12/16/2011  Findings:  The heart size appears normal.  Asymmetric opacification within the left midlung and left base is identified.  This is not significantly changed from the previous exam.  Plate-like atelectasis is noted within the perihilar right midlung.  IMPRESSION:  1.  Left perihilar opacity which is unchanged from previous study. 2.  New plate-like atelectasis within the right midlung.  Original Report Authenticated By: Rosealee Albee, M.D.    Assessment/Plan: Meningismus slightly increased as compared to yesterday.Continuing vigorous rehydration, continues on albumin drip. For TCDs today. We'll add Xanax 0.5 mg every 8 hours to see if that will help with headache management. Continue IVC drainage.   Hewitt Shorts, MD 12/28/2011, 8:33 AM

## 2011-12-29 ENCOUNTER — Inpatient Hospital Stay (HOSPITAL_COMMUNITY): Payer: PRIVATE HEALTH INSURANCE

## 2011-12-29 LAB — GLUCOSE, CAPILLARY
Glucose-Capillary: 108 mg/dL — ABNORMAL HIGH (ref 70–99)
Glucose-Capillary: 141 mg/dL — ABNORMAL HIGH (ref 70–99)
Glucose-Capillary: 194 mg/dL — ABNORMAL HIGH (ref 70–99)

## 2011-12-29 LAB — COMPREHENSIVE METABOLIC PANEL
Albumin: 3.4 g/dL — ABNORMAL LOW (ref 3.5–5.2)
BUN: 11 mg/dL (ref 6–23)
Creatinine, Ser: 0.42 mg/dL — ABNORMAL LOW (ref 0.50–1.10)
Total Bilirubin: 0.3 mg/dL (ref 0.3–1.2)
Total Protein: 5.5 g/dL — ABNORMAL LOW (ref 6.0–8.3)

## 2011-12-29 LAB — BASIC METABOLIC PANEL
CO2: 26 mEq/L (ref 19–32)
CO2: 26 mEq/L (ref 19–32)
Chloride: 100 mEq/L (ref 96–112)
Chloride: 101 mEq/L (ref 96–112)
GFR calc Af Amer: >90 mL/min (ref >90)
Potassium: 3.4 mEq/L — ABNORMAL LOW (ref 3.5–5.1)
Sodium: 137 mEq/L (ref 135–145)
Sodium: 139 mEq/L (ref 135–145)

## 2011-12-29 LAB — CBC
HCT: 34.6 % — ABNORMAL LOW (ref 36.0–46.0)
MCV: 91.8 fL (ref 78.0–100.0)
Platelets: 319 10*3/uL (ref 150–400)
RBC: 3.77 MIL/uL — ABNORMAL LOW (ref 3.87–5.11)
WBC: 9.7 10*3/uL (ref 4.0–10.5)

## 2011-12-29 LAB — DIFFERENTIAL
Eosinophils Relative: 0 % (ref 0–5)
Lymphocytes Relative: 5 % — ABNORMAL LOW (ref 12–46)
Lymphs Abs: 0.5 10*3/uL — ABNORMAL LOW (ref 0.7–4.0)

## 2011-12-29 MED ORDER — POTASSIUM CHLORIDE CRYS ER 20 MEQ PO TBCR
EXTENDED_RELEASE_TABLET | ORAL | Status: AC
  Filled 2011-12-29: qty 2

## 2011-12-29 MED ORDER — POTASSIUM CHLORIDE CRYS ER 20 MEQ PO TBCR
40.0000 meq | EXTENDED_RELEASE_TABLET | Freq: Once | ORAL | Status: AC
  Administered 2011-12-29: 40 meq via ORAL

## 2011-12-29 MED ORDER — LACTULOSE 10 GM/15ML PO SOLN
30.0000 g | Freq: Every day | ORAL | Status: DC | PRN
  Administered 2011-12-29: 30 g via ORAL
  Filled 2011-12-29: qty 45

## 2011-12-29 NOTE — Progress Notes (Signed)
Danielle Riddle is a 66 y.o. female admitted on 12/16/2011 with headache, n/v, photophobia, and confusion.  Found to have SAH.  Had tx for asthma exacerbation, and sinusitis prior to admit (Followed by Dr. Shelle Iron as outpt).  PCCM consulted 5/3 for medical management while in ICU.  Line/tube: Rt ventricular drain 5/3>>   Cultures: MRSA screen 5/3>>negative  Antibiotics: Zithromax 5/1>>5/4 Rocephin 5/4>>  Tests/events: 5/3 CT head>>SAH, no hydrocephalus 5/5 CT head>>decrease in [facility], no change in IVH, acute on chronic pansinusitis 5/12 CT head>>>evidence vasospasm 5/14- line placed, pos balance with fluid resuscitation, MAP met higher goals 5/15 - Vasospasm on TCD vert remains elevated 5/15- clinical improvement in headache, pos 2.5 liters successful without hypoxia  SUBJECTIVE: 5/15- clinical improvement in headache, pos 2.5 liters successful without hypoxia  OBJECTIVE:  Blood pressure 133/74, pulse 76, temperature 98.2 F (36.8 C), temperature source Oral, resp. rate 12, height 5\' 6"  (1.676 m), weight 73.7 kg (162 lb 7.7 oz), SpO2 98.00%. Wt Readings from Last 3 Encounters:  12/29/11 73.7 kg (162 lb 7.7 oz)  12/14/11 64.955 kg (143 lb 3.2 oz)  09/20/11 65.046 kg (143 lb 6.4 oz)   Body mass index is 26.22 kg/(m^2).  I/O last 3 completed shifts: In: 12064.5 [P.O.:2982; I.V.:7672.5; IV Piggyback:1410] Out: 8647 [Urine:8260; Drains:387]  Physical Exam: General - less headache, slept well HEENT - Right IVC draining clear CSF, perrl 2 mm Cardiac - s1s2 regular Chest - good air entry, no crackles, maybe little reduced bases Abd - soft, nontender, no r/g Ext - no edema still Neuro - awake, normal strength all ext   Lab 12/29/11 0135 12/28/11 1820 12/28/11 1125  NA 136 136 137  K 3.5 3.1* 3.8  CL 100 99 101  CO2 29 28 28   BUN 11 9 9   CREATININE 0.42* 0.39* 0.42*  GLUCOSE 152* 152* 131*    Lab 12/29/11 0135 12/28/11 0150 12/26/11 0445  HGB 11.9* 12.2 13.7  HCT 34.6*  36.0 40.2  WBC 9.7 7.3 8.3  PLT 319 324 371    ASSESSMENT/PLAN:  SAH s/p Ventricular drain.  Intermittently has severe headaches. plan -HHH MAP goals to greater 70-75 - less 105, no levo required yet, with rising vert velocity , will adhere strict to this  -continue nimodipine -control headache>>prn tylenol, percocet, morphine, Depakote -consider taper decadron daily -TCd reviewed, concerning velocity, repeat ordered for am  -cvp goal 10 likely , again goal pos balance 1-2 liters -pcxr likely atx with improved aeration in setting pos balnce daily  -role papaverine?, I am concerned with velocity increases, however, clinically improved symtoms -Satatin started for vasospasm, goal 14 days treatment -cvp accuracy? Uric acid -Echo reviewed, await additional data on IVC, volume status -uric acic, UA, etc all show volume resusciatetd  Hyponatremia -\CSW likely Plan: This is likely CSW Florinef to contnue, follow na trend Pos balance goal  Acute on chronic sinusitis -continue flonase bid -continue loratidine -saline irrigation bid and prn -add CT sinus for Mondays CT head  Asthma with persistent cough -continue albuterol bid and q4h prn -pcxr in am for fluid status = edema? , lower rate fluids -continue hycodan for cough suppression  Constipation likely from narcotic use -continue bowel regimen -re dulx,add dose lactulose  Steroid induced hyperglycemia  Lab 12/28/11 1920 12/28/11 1554 12/28/11 1126 12/28/11 0801 12/28/11 0354  GLUCAP 139* 130* 129* 115* 179*  -CHO modified diet -controlled -cbg for decadron  Best practice -SCD for DVT prophylaxis -Protonix for SUP -HOB elevated   Reuel Boom  J. Tyson Alias, MD, FACP Pgr: [phone] [facility]

## 2011-12-29 NOTE — Progress Notes (Signed)
Subjective: Patient has been more comfortable, since starting Xanax yesterday. Needing less frequent morphine. Currently having only mild left frontal headache. IVC continued to drain well, CSF is xanthochromic. TCD yesterday continues to show increased velocities in the vertebrals.   Objective: Vital signs in last 24 hours: Filed Vitals:   12/29/11 0431 12/29/11 0500 12/29/11 0600 12/29/11 0700  BP:  115/59 118/53 107/60  Pulse:      Temp:      TempSrc:      Resp:  14 13 15   Height:      Weight: 73.7 kg (162 lb 7.7 oz)     SpO2:        Intake/Output from previous day: 05/15 0701 - 05/16 0700 In: 5734.5 [P.O.:2022; I.V.:3022.5; IV Piggyback:690] Out: 3905 [Urine:3710; Drains:195] Intake/Output this shift:    Physical Exam:  Awake and alert, oriented. Following commands. Moving all extremities well. No drift of upper extremities. Pupils 3 mm, bilaterally, round and reactive to light. Extraocular movements intact. Facial movements symmetrical. 2+ meningismus.   CBC  Basename 12/29/11 0135 12/28/11 0150  WBC 9.7 7.3  HGB 11.9* 12.2  HCT 34.6* 36.0  PLT 319 324   BMET  Basename 12/29/11 0135 12/28/11 1820  NA 136 136  K 3.5 3.1*  CL 100 99  CO2 29 28  GLUCOSE 152* 152*  BUN 11 9  CREATININE 0.42* 0.39*  CALCIUM 8.8 9.0    Studies/Results: Dg Chest Port 1 View  12/28/2011  *RADIOLOGY REPORT*  Clinical Data: Edema.  PORTABLE CHEST - 1 VIEW  Comparison: 12/27/2011  Findings: Bilateral lower lobe airspace opacities, possibly edema. Heart is borderline in size.  Mild vascular congestion.  Right central line is unchanged.  Layering left pleural effusion.  IMPRESSION: Worsening bilateral lower lobe airspace opacities, question edema.  New left pleural effusion.  Original Report Authenticated By: Cyndie Chime, M.D.   Dg Chest Portable 1 View  12/27/2011  *RADIOLOGY REPORT*  Clinical Data: Central line placement  PORTABLE CHEST - 1 VIEW  Comparison: 12/27/2011  Findings:  Cardiomediastinal silhouette is stable.  No acute infiltrate or pleural effusion.  No pulmonary edema.  Stable old right rib fractures.  Stable atelectasis right midlung.  There is a right IJ central line with tip in SVC right atrium junction.  No diagnostic pneumothorax.  IMPRESSION: Right IJ central line with tip in SVC right atrium junction.  No diagnostic pneumothorax.  Original Report Authenticated By: Natasha Mead, M.D.    Assessment/Plan: Neurologically stable. Continue current treatment. We'll check CT brain without on Monday the 20th.    Hewitt Shorts, MD 12/29/2011, 7:32 AM

## 2011-12-29 NOTE — Progress Notes (Signed)
Subjective: [facility] 12/15/11 Pt still in ICU HA so much better today for first time Conversing; pleasant   Objective: Vital signs in last 24 hours: Temp:  [98.2 F (36.8 C)-98.4 F (36.9 C)] 98.2 F (36.8 C) (05/16 0700) Resp:  [9-18] 18  (05/16 0900) BP: (107-146)/(48-95) 117/58 mmHg (05/16 0900) SpO2:  [95 %-99 %] 98 % (05/15 1900) Weight:  [162 lb 7.7 oz (73.7 kg)] 162 lb 7.7 oz (73.7 kg) (05/16 0431) Last BM Date:  (Prior to admission)  Intake/Output from previous day: 05/15 0701 - 05/16 0700 In: 7024.5 [P.O.:2022; I.V.:4072.5; IV Piggyback:930] Out: 4595 [Urine:4360; Drains:235] Intake/Output this shift: Total I/O In: 480 [I.V.:150; IV Piggyback:330] Out: 1363 [Urine:1350; Drains:13]  PE:  Vss; afeb Moves all 4s Conversing well Alert/oriented ventric intact- clearing   Lab Results:   Basename 12/29/11 0135 12/28/11 0150  WBC 9.7 7.3  HGB 11.9* 12.2  HCT 34.6* 36.0  PLT 319 324   BMET  Basename 12/29/11 0135 12/28/11 1820  NA 136 136  K 3.5 3.1*  CL 100 99  CO2 29 28  GLUCOSE 152* 152*  BUN 11 9  CREATININE 0.42* 0.39*  CALCIUM 8.8 9.0   PT/INR No results found for this basename: LABPROT:2,INR:2 in the last 72 hours ABG No results found for this basename: PHART:2,PCO2:2,PO2:2,HCO3:2 in the last 72 hours  Studies/Results: Dg Chest Port 1 View  12/29/2011  *RADIOLOGY REPORT*  Clinical Data: This is atelectasis  PORTABLE CHEST - 1 VIEW  Comparison: Chest radiograph 12/28/2011  Findings: Right central venous line is unchanged.  Stable cardiac silhouette.  There are bibasilar air space opacities not changed from prior. Left lower lobe atelectasis and effusion unchanged. Upper lungs are clear.  A focus of linear atelectasis in the right upper lobe. No pneumothorax. Remote left rib fractures.  IMPRESSION:  1.  No significant change. 2. Bibasilar air space disease, left lower lobe atelectasis, and left effusion.  Original Report Authenticated By: Genevive Bi, M.D.   Dg Chest Port 1 View  12/28/2011  *RADIOLOGY REPORT*  Clinical Data: Edema.  PORTABLE CHEST - 1 VIEW  Comparison: 12/27/2011  Findings: Bilateral lower lobe airspace opacities, possibly edema. Heart is borderline in size.  Mild vascular congestion.  Right central line is unchanged.  Layering left pleural effusion.  IMPRESSION: Worsening bilateral lower lobe airspace opacities, question edema.  New left pleural effusion.  Original Report Authenticated By: Cyndie Chime, M.D.   Dg Chest Portable 1 View  12/27/2011  *RADIOLOGY REPORT*  Clinical Data: Central line placement  PORTABLE CHEST - 1 VIEW  Comparison: 12/27/2011  Findings: Cardiomediastinal silhouette is stable.  No acute infiltrate or pleural effusion.  No pulmonary edema.  Stable old right rib fractures.  Stable atelectasis right midlung.  There is a right IJ central line with tip in SVC right atrium junction.  No diagnostic pneumothorax.  IMPRESSION: Right IJ central line with tip in SVC right atrium junction.  No diagnostic pneumothorax.  Original Report Authenticated By: Natasha Mead, M.D.    Anti-infectives: Anti-infectives     Start     Dose/Rate Route Frequency Ordered Stop   12/17/11 1400   cefTRIAXone (ROCEPHIN) 1 g in dextrose 5 % 50 mL IVPB        1 g 100 mL/hr over 30 Minutes Intravenous Every 24 hours 12/17/11 1302     12/16/11 0400   azithromycin (ZITHROMAX) 500 mg in dextrose 5 % 250 mL IVPB        500 mg  250 mL/hr over 60 Minutes Intravenous Every 24 hours 12/16/11 0313 12/17/11 0506          Assessment/Plan: s/p [facility] 12/15/11  Cerebral arteriogram x 2 shows no avm;avf; aneurysm Plan per Dr Newell Coral and Dr Brooks Sailors Call us if need [place] Follow up angio? Will report to Dr Bradly Chris A 12/29/2011

## 2011-12-30 DIAGNOSIS — I609 Nontraumatic subarachnoid hemorrhage, unspecified: Secondary | ICD-10-CM

## 2011-12-30 LAB — BASIC METABOLIC PANEL
CO2: 28 mEq/L (ref 19–32)
Calcium: 9.1 mg/dL (ref 8.4–10.5)
Creatinine, Ser: 0.41 mg/dL — ABNORMAL LOW (ref 0.50–1.10)
GFR calc non Af Amer: >90 mL/min (ref >90)
GFR calc non Af Amer: >90 mL/min (ref >90)
Glucose, Bld: 150 mg/dL — ABNORMAL HIGH (ref 70–99)
Glucose, Bld: 196 mg/dL — ABNORMAL HIGH (ref 70–99)
Potassium: 3.9 mEq/L (ref 3.5–5.1)
Sodium: 136 mEq/L (ref 135–145)
Sodium: 140 mEq/L (ref 135–145)

## 2011-12-30 MED ORDER — ALUM & MAG HYDROXIDE-SIMETH 200-200-20 MG/5ML PO SUSP
30.0000 mL | ORAL | Status: DC | PRN
  Administered 2011-12-30: 30 mL via ORAL
  Filled 2011-12-30: qty 30

## 2011-12-30 NOTE — Progress Notes (Signed)
Danielle Riddle is a 66 y.o. female admitted on 12/16/2011 with headache, n/v, photophobia, and confusion.  Found to have SAH.  Had tx for asthma exacerbation, and sinusitis prior to admit (Followed by Dr. Shelle Iron as outpt).  PCCM consulted 5/3 for medical management while in ICU.  Line/tube: Rt ventricular drain 5/3>>   Cultures: MRSA screen 5/3>>negative  Antibiotics: Zithromax 5/1>>5/4 Rocephin 5/4>>  Tests/events: 5/3 CT head>>SAH, no hydrocephalus 5/5 CT head>>decrease in [facility], no change in IVH, acute on chronic pansinusitis 5/12 CT head>>>evidence vasospasm 5/14- line placed, pos balance with fluid resuscitation, MAP met higher goals 5/15 - Vasospasm on TCD vert remains elevated 5/15- clinical improvement in headache, pos 2.5 liters successful without hypoxia  SUBJECTIVE: Appears well, less headache  OBJECTIVE:  Blood pressure 123/67, pulse 76, temperature 97.8 F (36.6 C), temperature source Axillary, resp. rate 13, height 5\' 6"  (1.676 m), weight 73.9 kg (162 lb 14.7 oz), SpO2 98.00%. Wt Readings from Last 3 Encounters:  12/30/11 73.9 kg (162 lb 14.7 oz)  12/14/11 64.955 kg (143 lb 3.2 oz)  09/20/11 65.046 kg (143 lb 6.4 oz)   Body mass index is 26.30 kg/(m^2).  I/O last 3 completed shifts: In: 7820 [P.O.:900; I.V.:5830; IV Piggyback:1090] Out: 6051 [Urine:5752; Drains:299]  Physical Exam: General - no headache, slept well 2 days  in a row!! HEENT - Right IVC draining slight blood tinged CSF, perrl 3 mm Cardiac - s1s2 regular Chest - good air entry anterior Abd - soft, nontender, no r/g Ext - no edema Neuro - awake, normal strength all ext   Lab 12/30/11 0119 12/29/11 1800 12/29/11 1340  NA 136 139 137  K 3.9 3.3* 3.4*  CL 100 101 100  CO2 28 26 26   BUN 11 13 12   CREATININE 0.43* 0.41* 0.45*  GLUCOSE 150* 143* 145*    Lab 12/29/11 0135 12/28/11 0150 12/26/11 0445  HGB 11.9* 12.2 13.7  HCT 34.6* 36.0 40.2  WBC 9.7 7.3 8.3  PLT 319 324 371     ASSESSMENT/PLAN:  SAH s/p Ventricular drain.  Intermittently has severe headaches. plan -HHH MAP goals to greater 70-75 - less 105, on own successful, levo if needed -continue nimodipine -headache improved  -consider taper decadron daily?? Will d/w NS, to reduce today -TCd today, hope with headache resolution, veloc down? -cvp may not be accuarte -Satatin started for vasospasm, goal 14 days treatment -cvp accuracy, dc cvp -Echo reviewed, with cards, appears to be slight up, making cvp accuracy in ? CT head Monday per NS  Hyponatremia -CSW likely, improved Plan: Na 136, volume up now, will dc florinef and follow output further Chem in am   Acute on chronic sinusitis - CT sinus for Mondays CT head -continue regimen -low threshold dc anithistamine  Asthma with persistent cough -cough much improved, change cough supp to prn  Constipation likely from narcotic use -continue bowel regimen -did have BM, redose lactulose in am  Likely unless another bm moted  Steroid induced hyperglycemia  Lab 12/30/11 0817 12/29/11 2224 12/29/11 1644 12/29/11 1157 12/29/11 0750  GLUCAP 145* 156* 194* 141* 108*  -CHO modified diet -controlled well -cbg for decadron  Best practice -SCD for DVT prophylaxis -Protonix for SUP, still required -HOB elevated per drain needs as well  Will follow all weekend with NS  Mcarthur Rossetti. Tyson Alias, MD, FACP Pgr: [phone] [facility]

## 2011-12-30 NOTE — Progress Notes (Signed)
Subjective:  Overall doing better, less headache. IVC continuing to drain xanthochromic CSF.  Objective: Vital signs in last 24 hours: Filed Vitals:   12/30/11 0400 12/30/11 0500 12/30/11 0600 12/30/11 0700  BP: 127/71 118/56 106/49 111/57  Pulse: 71 80 74 76  Temp: 97.7 F (36.5 C)     TempSrc: Oral     Resp: 14 16 13 13   Height:      Weight:      SpO2: 98% 99% 97% 97%    Intake/Output from previous day: 05/16 0701 - 05/17 0700 In: 5620 [P.O.:900; I.V.:4030; IV Piggyback:690] Out: 4971 [Urine:4752; Drains:219] Intake/Output this shift:    Physical Exam:  Awake and alert, oriented. Moving all 4 extremities well. No drift of upper extremities. 1 to 2+ meningismus.  CBC  Basename 12/29/11 0135 12/28/11 0150  WBC 9.7 7.3  HGB 11.9* 12.2  HCT 34.6* 36.0  PLT 319 324   BMET  Basename 12/30/11 0119 12/29/11 1800  NA 136 139  K 3.9 3.3*  CL 100 101  CO2 28 26  GLUCOSE 150* 143*  BUN 11 13  CREATININE 0.43* 0.41*  CALCIUM 9.0 9.1    Studies/Results: Dg Chest Port 1 View  12/29/2011  *RADIOLOGY REPORT*  Clinical Data: This is atelectasis  PORTABLE CHEST - 1 VIEW  Comparison: Chest radiograph 12/28/2011  Findings: Right central venous line is unchanged.  Stable cardiac silhouette.  There are bibasilar air space opacities not changed from prior. Left lower lobe atelectasis and effusion unchanged. Upper lungs are clear.  A focus of linear atelectasis in the right upper lobe. No pneumothorax. Remote left rib fractures.  IMPRESSION:  1.  No significant change. 2. Bibasilar air space disease, left lower lobe atelectasis, and left effusion.  Original Report Authenticated By: Genevive Bi, M.D.    Assessment/Plan: Seems to be improving. For CT brain without on May 20. May be able to raise IVC gradually next week. To be followed by Dr. Jeral Fruit this weekend, and then by Dr. Danielle Dess, until I return on May 23. Spoken with the patient and her husband about her condition and her plans  for treatment.   Hewitt Shorts, MD 12/30/2011, 8:05 AM

## 2011-12-30 NOTE — Progress Notes (Signed)
VASCULAR LAB PRELIMINARY  PRELIMINARY  PRELIMINARY  PRELIMINARY  TCD completed.    Preliminary report:  TCD completed results found with study of 12/16/2011  Toma Deiters D, RVS 12/30/2011, 11:38 AM

## 2011-12-30 NOTE — Progress Notes (Addendum)
Clinical Social Worker received referral for assistance with dc planning.  CSW reviewed chart and spoke with RN.  Pt currently asleep and did not awake to pt's name being called.  CSW noted that PT has been ordered, but have not been abel to evaluate at this point.  CSW to continue to follow and assess as appropriate.    Angelia Mould, MSW, [place] [phone]

## 2011-12-31 ENCOUNTER — Inpatient Hospital Stay (HOSPITAL_COMMUNITY): Payer: PRIVATE HEALTH INSURANCE

## 2011-12-31 LAB — BASIC METABOLIC PANEL
BUN: 12 mg/dL (ref 6–23)
BUN: 9 mg/dL (ref 6–23)
CO2: 24 mEq/L (ref 19–32)
CO2: 27 mEq/L (ref 19–32)
Calcium: 9 mg/dL (ref 8.4–10.5)
Calcium: 9.1 mg/dL (ref 8.4–10.5)
Creatinine, Ser: 0.42 mg/dL — ABNORMAL LOW (ref 0.50–1.10)
Creatinine, Ser: 0.43 mg/dL — ABNORMAL LOW (ref 0.50–1.10)
GFR calc non Af Amer: >90 mL/min (ref >90)
GFR calc non Af Amer: >90 mL/min (ref >90)
Glucose, Bld: 220 mg/dL — ABNORMAL HIGH (ref 70–99)
Glucose, Bld: 182 mg/dL — ABNORMAL HIGH (ref 70–99)
Sodium: 138 mEq/L (ref 135–145)
Sodium: 140 mEq/L (ref 135–145)

## 2011-12-31 LAB — GLUCOSE, CAPILLARY

## 2011-12-31 MED ORDER — SODIUM CHLORIDE 0.9 % IV SOLN
INTRAVENOUS | Status: DC
  Administered 2012-01-01 – 2012-01-03 (×5): via INTRAVENOUS

## 2011-12-31 MED ORDER — DEXAMETHASONE SODIUM PHOSPHATE 4 MG/ML IJ SOLN
4.0000 mg | Freq: Two times a day (BID) | INTRAMUSCULAR | Status: DC
  Administered 2011-12-31: 4 mg via INTRAVENOUS
  Filled 2011-12-31 (×2): qty 1

## 2011-12-31 MED ORDER — ZOLPIDEM TARTRATE 5 MG PO TABS
5.0000 mg | ORAL_TABLET | Freq: Every evening | ORAL | Status: DC | PRN
  Administered 2011-12-31 – 2012-01-06 (×7): 5 mg via ORAL
  Filled 2011-12-31 (×7): qty 1

## 2011-12-31 NOTE — Progress Notes (Signed)
Danielle Riddle is a 66 y.o. female admitted on 12/16/2011 with headache, n/v, photophobia, and confusion.  Found to have SAH.  Had tx for asthma exacerbation, and sinusitis prior to admit (Followed by Dr. Shelle Iron as outpt).  PCCM consulted 5/3 for medical management while in ICU.  Line/tube: Rt ventricular drain 5/3>>   Cultures: MRSA screen 5/3>>negative  Antibiotics: Zithromax 5/1>>5/4 Rocephin 5/4>>  Tests/events: 5/3 CT head>>SAH, no hydrocephalus 5/5 CT head>>decrease in [facility], no change in IVH, acute on chronic pansinusitis 5/12 CT head>>>evidence vasospasm 5/14- line placed, pos balance with fluid resuscitation, MAP met higher goals 5/15 - Vasospasm on TCD vert remains elevated 5/15- clinical improvement in headache, pos 2.5 liters successful without hypoxia  SUBJECTIVE: To chair successful, pos balance goal met  OBJECTIVE:  Blood pressure 112/58, pulse 86, temperature 97.8 F (36.6 C), temperature source Oral, resp. rate 17, height 5\' 6"  (1.676 m), weight 71.6 kg (157 lb 13.6 oz), SpO2 100.00%. Wt Readings from Last 3 Encounters:  12/31/11 71.6 kg (157 lb 13.6 oz)  12/14/11 64.955 kg (143 lb 3.2 oz)  09/20/11 65.046 kg (143 lb 6.4 oz)   Body mass index is 25.48 kg/(m^2).  I/O last 3 completed shifts: In: 9210 [P.O.:1280; I.V.:6800; IV Piggyback:1130] Out: 1610 [RUEAV:4098; Drains:361]  Physical Exam: General - no headache, appears well HEENT - Right IVC draining clearer each day, perrl 3 mm Cardiac - s1s2 regular Chest - good air entry anterior Abd - soft, nontender, no r/g, another BM noted Ext - no edema Neuro - awake, normal strength all ext   Lab 12/31/11 0201 12/30/11 1420 12/30/11 0119  NA 138 140 136  K 4.0 3.7 3.9  CL 102 104 100  CO2 24 26 28   BUN 12 11 11   CREATININE 0.42* 0.41* 0.43*  GLUCOSE 220* 196* 150*    Lab 12/29/11 0135 12/28/11 0150 12/26/11 0445  HGB 11.9* 12.2 13.7  HCT 34.6* 36.0 40.2  WBC 9.7 7.3 8.3  PLT 319 324 371     ASSESSMENT/PLAN:  SAH s/p Ventricular drain.  Intermittently has severe headaches. plan -HHH MAP goals to greater 70-75 - less 105, on own successful -continue nimodipine -headache almost resolved -reduce decadron -TCd today, saw were done, await result -Satatin started for vasospasm, goal 14 days treatment CT head Monday per NS PT called, to chair, drain per NS  Hyponatremia -CSW likely, improved Plan: Na improved daily Dc albumin NACl to 150  Acute on chronic sinusitis - CT sinus for Mondays CT head -continue regimen  Asthma with persistent cough -cough much improved, change cough supp to prn  Constipation likely from narcotic use -continue bowel regimen -improved, 2 BM noted  Steroid induced hyperglycemia  Lab 12/30/11 2133 12/30/11 1618 12/30/11 1121 12/30/11 0817 12/29/11 2224  GLUCAP 137* 221* 162* 145* 156*  -CHO modified diet -controlled well -cbg for decadron, have reduce decadron  Best practice -SCD for DVT prophylaxis -Protonix for SUP, still required -HOB elevated per drain needs as well  Will follow all weekend with NS  Mcarthur Rossetti. Tyson Alias, MD, FACP Pgr: [phone] [facility]

## 2011-12-31 NOTE — Evaluation (Signed)
Physical Therapy Evaluation Patient Details Name: Danielle Riddle MRN: [id number] DOB: [birth year] Today's Date: 12/31/2011 Time: 0930-1005 PT Time Calculation (min): 35 min  PT Assessment / Plan / Recommendation Clinical Impression  Pt is 66 y/o female admitted for subarachnoid hemorrhage with IVC insert.  Pt will benefit from acute PT services to improve overall balance, strength and mobility to prepare for safe d/c home.     PT Assessment  Patient needs continued PT services    Follow Up Recommendations  Home health PT;Supervision/Assistance - 24 hour    Barriers to Discharge        lEquipment Recommendations   (Will further assess to determine assistive device needs)    Recommendations for Other Services     Frequency Min 4X/week    Precautions / Restrictions Precautions Precautions: Fall   Pertinent Vitals/Pain C/o headache but does not rate      Mobility  Bed Mobility Bed Mobility: Supine to Sit;Sit to Supine Supine to Sit: 3: Mod assist;HOB flat;With rails Sit to Supine: 3: Mod assist;HOB flat;With rail Details for Bed Mobility Assistance: (A) to elevate shoulders OOB with cues for technique Transfers Transfers: Stand to Sit;Sit to Stand Sit to Stand: 1: +2 Total assist;From bed Sit to Stand: Patient Percentage: 70% Stand to Sit: 1: +2 Total assist;To elevated surface;To bed Stand to Sit: Patient Percentage: 70% Details for Transfer Assistance: (A) to initiate transfer with cues for hand placement.  +2 assist for safety and with lines Ambulation/Gait Ambulation/Gait Assistance: 1: +2 Total assist Ambulation/Gait: Patient Percentage: 70% Ambulation Distance (Feet): 50 Feet Assistive device: 2 person hand held assist Ambulation/Gait Assistance Details: (A) to maintain balance with cues for proper LE placement. Gait Pattern: Step-through pattern;Decreased step length - right;Decreased step length - left;Shuffle;Wide base of support;Trunk flexed    Exercises  General Exercises - Lower Extremity Ankle Circles/Pumps: 10 reps;Strengthening;Seated Hip Flexion/Marching: Strengthening;10 reps;Both;Seated   PT Diagnosis: Difficulty walking;Abnormality of gait;Generalized weakness  PT Problem List: Decreased strength;Decreased activity tolerance;Decreased balance;Decreased mobility;Decreased knowledge of use of DME PT Treatment Interventions: DME instruction;Gait training;Functional mobility training;Patient/family education;Therapeutic activities;Therapeutic exercise;Stair training;Balance training;Neuromuscular re-education   PT Goals Acute Rehab PT Goals PT Goal Formulation: With patient Time For Goal Achievement: 01/14/12 Potential to Achieve Goals: Good Pt will go Supine/Side to Sit: with supervision PT Goal: Supine/Side to Sit - Progress: Goal set today Pt will go Sit to Supine/Side: with supervision PT Goal: Sit to Supine/Side - Progress: Goal set today Pt will go Sit to Stand: with supervision PT Goal: Sit to Stand - Progress: Goal set today Pt will go Stand to Sit: with supervision PT Goal: Stand to Sit - Progress: Goal set today Pt will Stand: with supervision;3 - 5 min PT Goal: Stand - Progress: Goal set today Pt will Ambulate: >150 feet;with supervision;with least restrictive assistive device PT Goal: Ambulate - Progress: Goal set today Pt will Go Up / Down Stairs: 3-5 stairs;with min assist;with least restrictive assistive device PT Goal: Up/Down Stairs - Progress: Goal set today  Visit Information  Last PT Received On: 12/31/11 Assistance Needed: +2 (safety with lines)    Subjective Data  Subjective: "I'm ready to get out of this bed." Patient Stated Goal: To return home and get back to gardening   Prior Functioning  Home Living Lives With: Spouse Available Help at Discharge: Family Type of Home: House Home Access: Stairs to enter Secretary/administrator of Steps: 3 Entrance Stairs-Rails: Right;Left Home Layout: One  level Bathroom Shower/Tub: Walk-in shower;Door [employer]: Standard  Bathroom Accessibility: Yes How Accessible: Accessible via wheelchair;Accessible via walker Home Adaptive Equipment: Walker - rolling;Straight cane;Bedside commode/3-in-1 Prior Function Level of Independence: Independent Able to Take Stairs?: Yes Driving: Yes Vocation: Works at home Communication Communication: No difficulties Dominant Hand: Right    Cognition  Overall Cognitive Status: Appears within functional limits for tasks assessed/performed    Extremity/Trunk Assessment Right Lower Extremity Assessment RLE ROM/Strength/Tone: Deficits RLE ROM/Strength/Tone Deficits: At least 3/5 gross with functional mobility Left Lower Extremity Assessment LLE ROM/Strength/Tone: Deficits LLE ROM/Strength/Tone Deficits: At least 3/5 functional mobility   Balance Balance Balance Assessed: Yes Static Sitting Balance Static Sitting - Balance Support: Feet supported Static Sitting - Level of Assistance: 5: Stand by assistance Static Sitting - Comment/# of Minutes: ~10 minutes prior to transfers  End of Session PT - End of Session Equipment Utilized During Treatment: Gait belt Activity Tolerance: Patient tolerated treatment well Patient left: in bed;with call bell/phone within reach (Pt return to bed and going off floor for CT) Nurse Communication: Mobility status   Daleon Willinger 12/31/2011, 1:01 PM Jake Shark, PT DPT [phone]

## 2011-12-31 NOTE — Progress Notes (Signed)
CSW noted PT recommendation for HHPT.  Pt lives with husband.  CSW spoke with RN and requested CM referral be made.  CSW signing off.  Please re consult if additional needs arise. Milus Banister MSW,LCSW w/e Coverage [phone]

## 2011-12-31 NOTE — Progress Notes (Signed)
Patient ID: Danielle Riddle, female   DOB: 1946-05-01, 66 y.o.   MRN: [id number] C/o of left temporal ha. Neuro stable. ivc working well . To get a new ct head

## 2012-01-01 ENCOUNTER — Inpatient Hospital Stay (HOSPITAL_COMMUNITY): Payer: PRIVATE HEALTH INSURANCE

## 2012-01-01 LAB — BASIC METABOLIC PANEL
CO2: 27 mEq/L (ref 19–32)
Glucose, Bld: 178 mg/dL — ABNORMAL HIGH (ref 70–99)
Potassium: 3.7 mEq/L (ref 3.5–5.1)
Sodium: 139 mEq/L (ref 135–145)

## 2012-01-01 LAB — CBC
HCT: 35.7 % — ABNORMAL LOW (ref 36.0–46.0)
MCV: 93.7 fL (ref 78.0–100.0)
RBC: 3.81 MIL/uL — ABNORMAL LOW (ref 3.87–5.11)
WBC: 10.5 10*3/uL (ref 4.0–10.5)

## 2012-01-01 LAB — GLUCOSE, CAPILLARY: Glucose-Capillary: 147 mg/dL — ABNORMAL HIGH (ref 70–99)

## 2012-01-01 LAB — VALPROIC ACID LEVEL: Valproic Acid Lvl: 51.7 ug/mL (ref 50.0–100.0)

## 2012-01-01 LAB — SODIUM, URINE, RANDOM: Sodium, Ur: 95 mEq/L

## 2012-01-01 MED ORDER — DEXAMETHASONE SODIUM PHOSPHATE 4 MG/ML IJ SOLN
2.0000 mg | Freq: Two times a day (BID) | INTRAMUSCULAR | Status: DC
  Administered 2012-01-01 (×2): 2 mg via INTRAVENOUS
  Filled 2012-01-01 (×2): qty 0.5

## 2012-01-01 NOTE — Progress Notes (Signed)
Patient ID: Danielle Riddle, female   DOB: [birth year], 66 y.o.   MRN: [id number] Neuro  Doing great. ivc working well with clearing of csf. Spoke with husband

## 2012-01-01 NOTE — Progress Notes (Signed)
Danielle Riddle is a 66 y.o. female admitted on 12/16/2011 with headache, n/v, photophobia, and confusion.  Found to have SAH.  Had tx for asthma exacerbation, and sinusitis prior to admit (Followed by Dr. Shelle Iron as outpt).  PCCM consulted 5/3 for medical management while in ICU.  Line/tube: Rt ventricular drain 5/3>>   Cultures: MRSA screen 5/3>>negative  Antibiotics: Zithromax 5/1>>5/4 Rocephin 5/4>>  Tests/events: 5/3 CT head>>SAH, no hydrocephalus 5/5 CT head>>decrease in [facility], no change in IVH, acute on chronic pansinusitis 5/12 CT head>>>evidence vasospasm 5/14- line placed, pos balance with fluid resuscitation, MAP met higher goals 5/15 - Vasospasm on TCD vert remains elevated 5/15- clinical improvement in headache, pos 2.5 liters successful without hypoxia 5/17 CT head . Sinus- Normalization of ventricular size.<BR>2. Minimal residual subarachnoid and intraventricular blood, products continue improve.<3. No new hemorrhage or acute abnormality.<BR>4. Sinus disease has improved since the prior study as well  SUBJECTIVE: Neg balance noted, symptoms improved  OBJECTIVE:  Blood pressure 117/67, pulse 85, temperature 98.1 F (36.7 C), temperature source Oral, resp. rate 18, height 5\' 6"  (1.676 m), weight 75.7 kg (166 lb 14.2 oz), SpO2 100.00%. Wt Readings from Last 3 Encounters:  01/01/12 75.7 kg (166 lb 14.2 oz)  12/14/11 64.955 kg (143 lb 3.2 oz)  09/20/11 65.046 kg (143 lb 6.4 oz)   Body mass index is 26.94 kg/(m^2).  I/O last 3 completed shifts: In: 7450 [P.O.:560; I.V.:6450; IV Piggyback:440] Out: 7645 [Urine:7200; Drains:445]  Physical Exam: General - headache HEENT - Right IVC draining clear, perrl 3 mm Cardiac - s1s2 regular Chest - good air entry anterior Abd - soft, nontender, no r/g, bm daily Ext - no edema Neuro - awake, normal strength all ext   Lab 01/01/12 0209 12/31/11 1346 12/31/11 0201  NA 139 140 138  K 3.7 3.6 4.0  CL 101 102 102  CO2 27 27 24     BUN 12 9 12   CREATININE 0.44* 0.43* 0.42*  GLUCOSE 178* 182* 220*    Lab 01/01/12 0209 12/29/11 0135 12/28/11 0150  HGB 12.0 11.9* 12.2  HCT 35.7* 34.6* 36.0  WBC 10.5 9.7 7.3  PLT 303 319 324    ASSESSMENT/PLAN:  SAH s/p Ventricular drain.  Intermittently has severe headaches. plan -HHH MAP goals to greater 70-75 - less 105, on own successful -continue nimodipine, 21 days planned -CT reviewed from yesterday -reduce decadron again today to 2 mg -TCd awaited result -Satatin started for vasospasm, goal 14 days treatment CT head Monday can dc PT increasing daily Re order depakote level, didn't see result when ordered Friday?  -at this stage, TCD improved, clinically improved and now day 15-16 post bleed, will reduce fluids slight and allow some neg balance -current output likely iatrogenic at this stage, keep MAP goals same during this process  Hyponatremia -CSW likely, improved Plan: Na improved bmet to qday , dc q12h Allow neg balance Urine na  Acute on chronic sinusitis - CT sinus improved, when ok by NS would dc abx  -continue regimen, dc prn benadryl and risks deliriurm with this  Asthma with persistent cough -cough much improved, change cough supp to prn  Constipation likely from narcotic use -BM daily now  Steroid induced hyperglycemia  Lab 12/31/11 2133 12/31/11 1549 12/31/11 1146 12/31/11 0832 12/30/11 2133  GLUCAP 117* 99 122* 126* 137*  -CHO modified diet -controlled well -cbg for decadron, have reduce decadron to 2 q12h then qday in am   Best practice -SCD for DVT prophylaxis -Protonix for SUP,  still required -HOB elevated per drain needs as well  Will follow daily  Mcarthur Rossetti. Tyson Alias, MD, FACP Pgr: [phone] [facility]

## 2012-01-02 DIAGNOSIS — I609 Nontraumatic subarachnoid hemorrhage, unspecified: Secondary | ICD-10-CM

## 2012-01-02 LAB — BASIC METABOLIC PANEL
BUN: 9 mg/dL (ref 6–23)
Calcium: 9.1 mg/dL (ref 8.4–10.5)
GFR calc non Af Amer: >90 mL/min (ref >90)
Glucose, Bld: 129 mg/dL — ABNORMAL HIGH (ref 70–99)
Potassium: 3.9 mEq/L (ref 3.5–5.1)

## 2012-01-02 LAB — GLUCOSE, CAPILLARY

## 2012-01-02 MED ORDER — HYDROMORPHONE HCL PF 1 MG/ML IJ SOLN
0.5000 mg | INTRAMUSCULAR | Status: DC | PRN
  Administered 2012-01-02 – 2012-01-04 (×2): 0.5 mg via INTRAVENOUS
  Filled 2012-01-02 (×2): qty 1

## 2012-01-02 MED ORDER — ALPRAZOLAM 0.5 MG PO TABS
0.5000 mg | ORAL_TABLET | Freq: Two times a day (BID) | ORAL | Status: DC
  Administered 2012-01-02 – 2012-01-03 (×3): 0.5 mg via ORAL
  Filled 2012-01-02 (×4): qty 1

## 2012-01-02 MED ORDER — HYDROMORPHONE HCL PF 1 MG/ML IJ SOLN
INTRAMUSCULAR | Status: AC
  Filled 2012-01-02: qty 1

## 2012-01-02 MED ORDER — DEXAMETHASONE SODIUM PHOSPHATE 4 MG/ML IJ SOLN
2.0000 mg | INTRAMUSCULAR | Status: AC
  Administered 2012-01-02 – 2012-01-03 (×2): via INTRAVENOUS
  Filled 2012-01-02: qty 0.5

## 2012-01-02 MED ORDER — HYDROMORPHONE HCL PF 1 MG/ML IJ SOLN
0.5000 mg | INTRAMUSCULAR | Status: DC | PRN
  Administered 2012-01-02: 0.5 mg via INTRAVENOUS

## 2012-01-02 MED ORDER — SODIUM CHLORIDE 0.9 % IV BOLUS (SEPSIS)
500.0000 mL | Freq: Once | INTRAVENOUS | Status: AC
  Administered 2012-01-02: 1000 mL via INTRAVENOUS

## 2012-01-02 NOTE — Progress Notes (Signed)
Brief Nutrition Note: Chart reviewed. Pt admitted for [facility], hospital day # 17. Pt 66 inches, 139 lbs with BMI of 22.5 (WNL). Pt denies recent wt changes, reports good appetite/inatake. Meal completion recorded as 100%.  CBG (last 3)   Basename 01/02/12 0340 01/01/12 1634 01/01/12 1151  GLUCAP 139* 134* 111*    Pt denies hx of DM, blood sugar checks due to steroids. Pt c/o not being allowed to order enough carbohydrates due to current diet ordered - CHO modified medium.  Spoke with RN who will discuss with MD for change back to regular diet.  No further nutrition intervention at this time. Please re-consult as needed. Kendell Bane Cornel[place] [phone]

## 2012-01-02 NOTE — Progress Notes (Signed)
MAILEN NEWBORN is a 66 y.o. female admitted on 12/16/2011 with headache, n/v, photophobia, and confusion.  Found to have SAH.  Had tx for asthma exacerbation, and sinusitis prior to admit (Followed by Dr. Shelle Iron as outpt).  PCCM consulted 5/3 for medical management while in ICU.  Line/tube: Rt ventricular drain 5/3>>   Cultures: MRSA screen 5/3>>negative  Antibiotics: Zithromax 5/1>>5/4 Rocephin 5/4>>  Tests/events: 5/3 CT head>>SAH, no hydrocephalus 5/5 CT head>>decrease in [facility], no change in IVH, acute on chronic pansinusitis 5/12 CT head>>>evidence vasospasm 5/14- line placed, pos balance with fluid resuscitation, MAP met higher goals 5/15 - Vasospasm on TCD vert remains elevated 5/15- clinical improvement in headache, pos 2.5 liters successful without hypoxia 5/17 CT head . Sinus- Normalization of ventricular size.<BR>2. Minimal residual subarachnoid and intraventricular blood, products continue improve.<3. No new hemorrhage or acute abnormality.<BR>4. Sinus disease has improved since the prior study as well  SUBJECTIVE: Neg balance noted, tolerated well, had a good night  OBJECTIVE:  Blood pressure 111/57, pulse 86, temperature 97.7 F (36.5 C), temperature source Oral, resp. rate 19, height 5\' 6"  (1.676 m), weight 75.4 kg (166 lb 3.6 oz), SpO2 98.00%. Wt Readings from Last 3 Encounters:  01/02/12 75.4 kg (166 lb 3.6 oz)  12/14/11 64.955 kg (143 lb 3.2 oz)  09/20/11 65.046 kg (143 lb 6.4 oz)   Body mass index is 26.83 kg/(m^2).  I/O last 3 completed shifts: In: 36 [P.O.:2290; I.V.:4200] Out: 98119 [Urine:9885; Drains:396]  Physical Exam: General - headache minimal to none HEENT - Right IVC draining clear, perrl 2 mm Cardiac - s1s2 regular Chest - good air entry anterior Abd - soft, nontender, no r/g Ext - no edema any ext Neuro - awake, normal strength all ext, chair   Lab 01/02/12 0500 01/01/12 0209 12/31/11 1346  NA 140 139 140  K 3.9 3.7 3.6  CL 101 101  102  CO2 33* 27 27  BUN 9 12 9   CREATININE 0.42* 0.44* 0.43*  GLUCOSE 129* 178* 182*    Lab 01/01/12 0209 12/29/11 0135 12/28/11 0150  HGB 12.0 11.9* 12.2  HCT 35.7* 34.6* 36.0  WBC 10.5 9.7 7.3  PLT 303 319 324    ASSESSMENT/PLAN:  SAH s/p Ventricular drain.  Intermittently has severe headaches. plan -we are out of likely time period for vasospasm -Unclear how to interpret vert velocity with such excellent clinical improvement and day 17 post bleed -Consider to not order any further tcd -allowing neg balance , as likely iatrogenic a this point, as we maintain MAp goals during this process -consider dc abx -wean decadron further to qday -will give valproic acid 4 more days then dc, out of window of seizure prevention at 21 days Drain management per NS advance pT Try to reduce slowly xanax nimodipne / statin  Keep fluids at 100 cc/hr for now  Hyponatremia -CSW likely, improved Plan: bme tin am  Allowing neg balance  Acute on chronic sinusitis - CT sinus improved, when ok by NS would dc abx  -continue regimen  Asthma with persistent cough -cough much improved, change cough supp to prn  Constipation likely from narcotic use -resolved  Steroid induced hyperglycemia  Lab 01/02/12 0340 01/01/12 1634 01/01/12 1151 01/01/12 0745 12/31/11 2133  GLUCAP 139* 134* 111* 147* 117*  -CHO modified diet -controlled well -may be able to dc fingerstick, decadron to qday  Best practice -SCD for DVT prophylaxis and ambulation -Protonix for SUP, still required -HOB elevated per drain needs as well -would  like to dc line after d/w NS  Will follow daily  Mcarthur Rossetti. Tyson Alias, MD, FACP Pgr: [phone] [facility]

## 2012-01-02 NOTE — Progress Notes (Signed)
Physical Therapy Treatment Patient Details Name: Danielle Riddle MRN: [id number] DOB: 10/02/1945 Today's Date: 01/02/2012 Time: 4098-1191 PT Time Calculation (min): 32 min  PT Assessment / Plan / Recommendation Comments on Treatment Session  Pt able to increase ambulation distance and improve overall mobility.   Re-assess strength due to pt c/o LLE weakness > RLE weakness.  Overall strength WFL.  WFL proprioception and sensation.  Pt showing weakness due to overall endurance.    Follow Up Recommendations  Home health PT;Supervision/Assistance - 24 hour    Barriers to Discharge        Equipment Recommendations  Rolling walker with 5" wheels    Recommendations for Other Services    Frequency Min 4X/week   Plan Discharge plan remains appropriate;Frequency remains appropriate    Precautions / Restrictions Precautions Precautions: Fall   Pertinent Vitals/Pain No c/o pain    Mobility  Bed Mobility Bed Mobility: Supine to Sit;Sit to Supine Supine to Sit: 4: Min guard;HOB elevated;With rails (HOB 30 degrees) Sit to Supine: 4: Min guard;HOB flat;HOB elevated (HOB 30 degrees) Details for Bed Mobility Assistance: Minguard for safety and pt needed extra time to complete bed mobility Transfers Transfers: Stand to Sit;Sit to Stand Sit to Stand: 4: Min assist;From bed Stand to Sit: 4: Min assist;To bed;To elevated surface Details for Transfer Assistance: (A) to initiate transfer and slowly descend to bed with max cues for hand placement.  Pt reported mild dizziness with initial standing Ambulation/Gait Ambulation/Gait Assistance: 1: +2 Total assist Ambulation/Gait: Patient Percentage: 70% Ambulation Distance (Feet): 160 Feet Assistive device: Rolling walker Ambulation/Gait Assistance Details: +2 assistance for lines and RW management;  Pt showing much improvement with less foot drag this session. Gait Pattern: Step-through pattern;Trunk flexed;Shuffle (occasional shuffle gait)      Exercises     PT Diagnosis:    PT Problem List:   PT Treatment Interventions:     PT Goals Acute Rehab PT Goals PT Goal Formulation: With patient Time For Goal Achievement: 01/14/12 Potential to Achieve Goals: Good Pt will go Supine/Side to Sit: with supervision PT Goal: Supine/Side to Sit - Progress: Progressing toward goal Pt will go Sit to Supine/Side: with supervision PT Goal: Sit to Supine/Side - Progress: Progressing toward goal Pt will go Sit to Stand: with supervision PT Goal: Sit to Stand - Progress: Progressing toward goal Pt will go Stand to Sit: with supervision PT Goal: Stand to Sit - Progress: Progressing toward goal Pt will Stand: with supervision;3 - 5 min PT Goal: Stand - Progress: Progressing toward goal Pt will Ambulate: >150 feet;with supervision;with least restrictive assistive device PT Goal: Ambulate - Progress: Progressing toward goal  Visit Information  Last PT Received On: 01/02/12 Assistance Needed: +2    Subjective Data      Cognition  Overall Cognitive Status: Appears within functional limits for tasks assessed/performed    Balance  Balance Balance Assessed: Yes Static Sitting Balance Static Sitting - Balance Support: Feet supported Static Sitting - Level of Assistance: 5: Stand by assistance Static Sitting - Comment/# of Minutes: ~5 minutes prior to transfer due to mild dizziness in sitting  End of Session PT - End of Session Equipment Utilized During Treatment: Gait belt Activity Tolerance: Patient tolerated treatment well Patient left: in bed;with call bell/phone within reach Nurse Communication: Mobility status    Debby Clyne 01/02/2012, 1:16 PM Jake Shark, PT DPT [phone]

## 2012-01-02 NOTE — Progress Notes (Signed)
Patient ID: Danielle Riddle, female   DOB: 07-Jan-1946, 66 y.o.   MRN: [id number] Vital signs stable complaining of significant episodic headache. Responds reasonably well to IV Dilaudid.  IVC is been draining in approximately 100 220 cc per shift. Drainage is at 0 cm of water pressure. Headache may be related to low pressure intracranially. I'll raise the IVC drainage to 10 cm of water hopefully this will curtail her headaches severity and also lessen the amount of total drainage. This will be in preparation for removal of the IVC.

## 2012-01-02 NOTE — Progress Notes (Signed)
At 14:30, pt, has been in severe pain for over an hour, morphine not relieving. Dr Tyson Alias and Dr Danielle Dess notified. Other than h/a, no neuro change noted. Will continue to monitor closely.

## 2012-01-03 DIAGNOSIS — K5900 Constipation, unspecified: Secondary | ICD-10-CM

## 2012-01-03 LAB — BASIC METABOLIC PANEL
BUN: 10 mg/dL (ref 6–23)
Chloride: 101 mEq/L (ref 96–112)
GFR calc Af Amer: >90 mL/min (ref >90)
Potassium: 4.4 mEq/L (ref 3.5–5.1)

## 2012-01-03 MED ORDER — MORPHINE SULFATE 4 MG/ML IJ SOLN
1.0000 mg | INTRAMUSCULAR | Status: DC | PRN
  Administered 2012-01-03 – 2012-01-05 (×10): 4 mg via INTRAVENOUS
  Administered 2012-01-05 (×2): 2 mg via INTRAVENOUS
  Administered 2012-01-05 (×2): 4 mg via INTRAVENOUS
  Filled 2012-01-03 (×15): qty 1

## 2012-01-03 NOTE — Progress Notes (Signed)
Physical Therapy Treatment Patient Details Name: Danielle Riddle MRN: [id number] DOB: [birth year] Today's Date: 01/03/2012 Time: 1135-1205 PT Time Calculation (min): 30 min  PT Assessment / Plan / Recommendation Comments on Treatment Session  pt presents with SAH with drain on R side.  pt unsteady and with scissoring gait, L crossing over R.  pt with R lateral lean and 2 LOB.  pt with ? slow cognition today and occasionally makes out of context comments.      Follow Up Recommendations  Home health PT;Supervision/Assistance - 24 hour    Barriers to Discharge        Equipment Recommendations  Rolling walker with 5" wheels    Recommendations for Other Services    Frequency Min 4X/week   Plan Discharge plan remains appropriate;Frequency remains appropriate    Precautions / Restrictions Precautions Precautions: Fall Restrictions Weight Bearing Restrictions: No   Pertinent Vitals/Pain     Mobility  Bed Mobility Bed Mobility: Sit to Supine Sit to Supine: HOB flat;4: Min assist Details for Bed Mobility Assistance: MinA with LEs only Transfers Transfers: Sit to Stand;Stand to Sit Sit to Stand: 4: Min assist;With upper extremity assist;From toilet Stand to Sit: 4: Min assist;With upper extremity assist;To toilet;To bed Details for Transfer Assistance: cues for use of UEs and A to initiate transfer and mildly unsteady.   Ambulation/Gait Ambulation/Gait Assistance: 4: Min assist Ambulation Distance (Feet): 150 Feet Assistive device: Rolling walker Ambulation/Gait Assistance Details: cues for widening BOS, attentnion to obstacles, use of RW, occasional lateral lean R.  2 LOB to R requiring A to correct.   Gait Pattern: Step-through pattern;Scissoring;Narrow base of support Stairs: No Wheelchair Mobility Wheelchair Mobility: No    Exercises     PT Diagnosis:    PT Problem List:   PT Treatment Interventions:     PT Goals Acute Rehab PT Goals Time For Goal Achievement:  01/14/12 PT Goal: Sit to Supine/Side - Progress: Progressing toward goal PT Goal: Sit to Stand - Progress: Progressing toward goal PT Goal: Stand to Sit - Progress: Progressing toward goal PT Goal: Ambulate - Progress: Progressing toward goal  Visit Information  Last PT Received On: 01/03/12 Assistance Needed: +1    Subjective Data  Subjective: I'm ready if you guys are.     Cognition  Overall Cognitive Status: Difficult to assess Cognition - Other Comments: pt oriented, but occasionally makes out of context comments.      Balance  Balance Balance Assessed: No  End of Session PT - End of Session Equipment Utilized During Treatment: Gait belt Activity Tolerance: Patient tolerated treatment well Patient left: in bed;with call bell/phone within reach;with family/visitor present Nurse Communication: Mobility status    Sunny Schlein, [place] [phone] 01/03/2012, 1:20 PM

## 2012-01-03 NOTE — Progress Notes (Signed)
Patient ID: Danielle Riddle, female   DOB: 1945-10-02, 66 y.o.   MRN: [id number] Patient's drainage remarkably decreased with IVC increase to 10 CM H2O. Patient also notes substantial improvement in severity of headache. Level of consciousness this morning appears normal. Neck appears supple with no evidence of meningismus. Plan: Increase IVC to 20 CM H2O. If neurologically patient remained stable and drainage is minimal may consider discontinuing intraventricular catheter.

## 2012-01-03 NOTE — Progress Notes (Signed)
Danielle Riddle is a 66 y.o. female admitted on 12/16/2011 with headache, n/v, photophobia, and confusion.  Found to have SAH.  Had tx for asthma exacerbation, and sinusitis prior to admit (Followed by Dr. Shelle Iron as outpt).  PCCM consulted 5/3 for medical management while in ICU.  Line/tube: Rt ventricular drain 5/3>>>  Cultures: MRSA screen 5/3>>negative  Antibiotics: Zithromax 5/1>>5/4 Rocephin 5/4>>  Tests/events: 5/3 CT head>>SAH, no hydrocephalus 5/5 CT head>>decrease in [facility], no change in IVH, acute on chronic pansinusitis 5/12 CT head>>>evidence vasospasm 5/14- line placed, pos balance with fluid resuscitation, MAP met higher goals 5/15 - Vasospasm on TCD vert remains elevated 5/15- clinical improvement in headache, pos 2.5 liters successful without hypoxia 5/17 CT head . Sinus- Normalization of ventricular size.<BR>2. Minimal residual subarachnoid and intraventricular blood, products continue improve.<3. No new hemorrhage or acute abnormality.<BR>4. Sinus disease has improved since the prior study as well  SUBJECTIVE: Headaches required dilauded 5/20, drain upward at 10   OBJECTIVE:  Blood pressure 106/59, pulse 84, temperature 98.6 F (37 C), temperature source Oral, resp. rate 12, height 5\' 6"  (1.676 m), weight 75.4 kg (166 lb 3.6 oz), SpO2 97.00%. Wt Readings from Last 3 Encounters:  01/02/12 75.4 kg (166 lb 3.6 oz)  12/14/11 64.955 kg (143 lb 3.2 oz)  09/20/11 65.046 kg (143 lb 6.4 oz)   Body mass index is 26.83 kg/(m^2).  I/O last 3 completed shifts: In: 8890 [P.O.:5290; I.V.:3600] Out: 45409 [WJXBJ:47829; Drains:316]  Physical Exam: General - headache unchanged HEENT - Right IVC draining clear at 10  Cardiac - s1s2 regular Chest - good air entry anterior Abd - soft, nontender, no r/g Ext - no edema any ext Neuro - awake, normal strength all ext, chair   Lab 01/03/12 0433 01/02/12 0500 01/01/12 0209  NA 141 140 139  K 4.4 3.9 3.7  CL 101 101 101  CO2  33* 33* 27  BUN 10 9 12   CREATININE 0.48* 0.42* 0.44*  GLUCOSE 125* 129* 178*    Lab 01/01/12 0209 12/29/11 0135 12/28/11 0150  HGB 12.0 11.9* 12.2  HCT 35.7* 34.6* 36.0  WBC 10.5 9.7 7.3  PLT 303 319 324    ASSESSMENT/PLAN:  SAH s/p Ventricular drain.  Intermittently has severe headaches. plan -we are out of likely time period for vasospasm -agree headaches likely related to csf drainage, appreciate movement of drain upward 10 -statin x 14 days then dc -tcd done Monday , will review when resulted, would suggest to dc further tcd -PT , ambulation around unit successful -decadron qday, remain -depakote to dc at day 21 Nimodipine, may extend past 21 days with vert velocity May add prn dilauded low dose  Hyponatremia -CSW likely, improved Plan: bmet in am to assess na No role florinef further -if NA drops will add oral salt Dc further boluses etc -allow neg balance -if na drops will re address siadh vs csw  Acute on chronic sinusitis - CT sinus improved, when ok by NS would dc abx  -continue regimen  Asthma with persistent cough - cough supp to prn  Constipation likely from narcotic use Steroid induced hyperglycemia Resolved Decadron to dc in am likely Off ssi  Best practice -SCD for DVT prophylaxis and ambulation -Protonix for SUP, still required -HOB elevated per drain needs as well -would like to dc line after drain removed, appears clean  Will follow daily  Mcarthur Rossetti. Tyson Alias, MD, FACP Pgr: [phone] [facility]

## 2012-01-03 NOTE — Care Management Note (Unsigned)
    Page 1 of 2   01/06/2012     4:59:04 PM   CARE MANAGEMENT NOTE 01/06/2012  Patient:  Danielle Riddle, Danielle Riddle   Account Number:  [id number]  Date Initiated:  12/23/2011  Documentation initiated by:  [facility]  Subjective/Objective Assessment:   Admitted with [facility] to ICU. Lives with spouse.     Action/Plan:   PT/OT evals after bedrest d/c'd  PT recommending HHPT with 24h supervision and rolling walker   Anticipated DC Date:  01/07/2012   Anticipated DC Plan:  HOME W HOME HEALTH SERVICES      DC Planning Services  CM consult      Choice offered to / List presented to:  C-3 Spouse           Status of service:  In process, will continue to follow Medicare Important Message given?   (If response is "NO", the following Medicare IM given date fields will be blank) Date Medicare IM given:   Date Additional Medicare IM given:    Discharge Disposition:    Per UR Regulation:  Reviewed for med. necessity/level of care/duration of stay  If discussed at Long Length of Stay Meetings, dates discussed:   12/28/2011    Comments:  Dr. Leo Grosser  01/06/12 Need order for home health PT and DME rolling walker. Have called [facility] and [facility] but still need orders prior to discharge. Anticipate D/C this weekend. [facility] [phone], fax [phone]   01/03/12 Spoke with patient and her husband about d/c plans. They are agreeable to HHPT and chose [facility] from the [facility] agencies list. Mr Labus stated that there will be 24h supervision between family and friends staying with the patient.Mr Racey will check to see if they have a rolling walker at home and let me know on 01/04/12. Contacted [facility] and verified that they work with [employer], referral department line unavailable.Will continue to follow to set up [facility] and DME as needed. Jacquelynn Cree RN, BSN, CCM

## 2012-01-04 LAB — BASIC METABOLIC PANEL
CO2: 32 mEq/L (ref 19–32)
Chloride: 97 mEq/L (ref 96–112)
Creatinine, Ser: 0.52 mg/dL (ref 0.50–1.10)
Sodium: 138 mEq/L (ref 135–145)

## 2012-01-04 LAB — PHOSPHORUS: Phosphorus: 4.6 mg/dL (ref 2.3–4.6)

## 2012-01-04 LAB — MAGNESIUM: Magnesium: 2.1 mg/dL (ref 1.5–2.5)

## 2012-01-04 MED ORDER — ALPRAZOLAM 0.25 MG PO TABS
0.2500 mg | ORAL_TABLET | Freq: Two times a day (BID) | ORAL | Status: DC
  Administered 2012-01-04 – 2012-01-05 (×4): 0.25 mg via ORAL
  Filled 2012-01-04 (×4): qty 1

## 2012-01-04 NOTE — Progress Notes (Signed)
Patient ID: Danielle Riddle, female   DOB: [birth year], 66 y.o.   MRN: [id number] Vital signs have been stable. Headache much better with IVC resistance increased. Drainage decreased to 64 cc total for 24 hours. In light of clinical stability and duration of ventricular drainage I have decided to remove the drain.  With a sterile Betadine prep to the area of the drainage site a mass of Dermabond along with 2 staples and at least 2 sutures was encountered at the drainage site. These were dissected free and the catheter was removed. 2 staples were applied at the exit of the catheter site. No spinal fluid leakage was noted when the patient coughed.  The patient can be mobilized plan is to discontinue antibiotics and discontinue the central line.

## 2012-01-04 NOTE — Progress Notes (Signed)
Danielle Riddle is a 66 y.o. female admitted on 12/16/2011 with headache, n/v, photophobia, and confusion.  Found to have SAH.  Had tx for asthma exacerbation, and sinusitis prior to admit (Followed by Dr. Shelle Iron as outpt).  PCCM consulted 5/3 for medical management while in ICU.  Line/tube: Rt ventricular drain 5/3>>>  Cultures: MRSA screen 5/3>>negative  Antibiotics: Zithromax 5/1>>5/4 Rocephin 5/4>>  Tests/events: 5/3 CT head>>SAH, no hydrocephalus 5/5 CT head>>decrease in [facility], no change in IVH, acute on chronic pansinusitis 5/12 CT head>>>evidence vasospasm 5/14- line placed, pos balance with fluid resuscitation, MAP met higher goals 5/15 - Vasospasm on TCD vert remains elevated 5/15- clinical improvement in headache, pos 2.5 liters successful without hypoxia 5/17 CT head . Sinus- Normalization of ventricular size.<BR>2. Minimal residual subarachnoid and intraventricular blood, products continue improve.<3. No new hemorrhage or acute abnormality.<BR>4. Sinus disease has improved since the prior study as well 5/20 TCD Elevated left middle cerebral mean flow velocities suggest early mild vasospasm. Improvement in bilateral vertebral vasospasm with now normal mean flow velocities cpmpared with TCD 12/30/11. -   SUBJECTIVE: headaches major improved with drain changes  OBJECTIVE:  Blood pressure 94/51, pulse 91, temperature 98.3 F (36.8 C), temperature source Oral, resp. rate 17, height 5\' 6"  (1.676 m), weight 75.4 kg (166 lb 3.6 oz), SpO2 100.00%. Wt Readings from Last 3 Encounters:  01/02/12 75.4 kg (166 lb 3.6 oz)  12/14/11 64.955 kg (143 lb 3.2 oz)  09/20/11 65.046 kg (143 lb 6.4 oz)   Body mass index is 26.83 kg/(m^2).  I/O last 3 completed shifts: In: 5489.6 [P.O.:2400; I.V.:3039.6; IV Piggyback:50] Out: 9716 [Urine:9605; Drains:111]  Physical Exam: General - headache absent HEENT - Right IVC draining clear at 20 Cardiac - s1s2 regular Chest - good air entry  anterior to bases Abd - soft, nontender, no r/g Ext - no edema any ext Neuro - awake, normal strength all ext, chair, she walked th icu   Lab 01/04/12 0330 01/03/12 0433 01/02/12 0500  NA 138 141 140  K 4.1 4.4 3.9  CL 97 101 101  CO2 32 33* 33*  BUN 12 10 9   CREATININE 0.52 0.48* 0.42*  GLUCOSE 189* 125* 129*    Lab 01/01/12 0209 12/29/11 0135  HGB 12.0 11.9*  HCT 35.7* 34.6*  WBC 10.5 9.7  PLT 303 319    ASSESSMENT/PLAN:  SAH s/p Ventricular drain.  Intermittently has severe headaches. plan -we are out of likely time period for vasospasm, I am happy to see resolution of vert, MCA likely has no significance this far out -dc any further tcd, no role to guide management further -headaches better as well with drain changes -statin x 14 days then dc -PT , ambulation around unit successful daily -decadron qday, dc today -depakote to dc at day 21, allow this to dc thur Nimodipine, may extend past 21 days with vert velocity -drain may be dc'ed today -dc steroids -xanax reduction , slow tapper  Hyponatremia -CSW likely, likely resolved, output auto diuresis from [facility] Plan: Allow neg balance, tcd reviewed No role florinef further bmet in am  With neg balance, Na wnl = NOT CSW No SIADH etc Reduce to kvo, again allow neg balance, assume in 2 days will even out again output  Acute on chronic sinusitis - CT sinus improve, when ok by NS would dc abx, at this stage of 20 days , risk cdiff an issue, will dc ABX, drain likely to come out -continue regimend  Asthma with persistent cough -  cough supp, resolved  Constipation likely from narcotic use -senna, BM daily  Steroid induced hyperglycemia Resolved Decadron to dc in am today Off ssi  Dc line today 5/22  Best practice -SCD for DVT prophylaxis and ambulation -Protonix for SUP, still required -HOB elevated per drain needs as well -would like to dc line today foley out  Will follow daily, Dr. Herma Carson in am , will  sign out to him  Mcarthur Rossetti. Tyson Alias, MD, FACP Pgr: [phone] [facility]

## 2012-01-04 NOTE — Progress Notes (Signed)
Chaplain Note:  Chaplain visited with pt and pt's husband.  Pt was in bed, awake, and enjoying a cup of frozen yogurt.  Pt' spouse was seated at bedside.  Chaplain provided spiritual comfort and support for pt and family.  Pt and pt's spouse expressed appreciation for chaplain support. Chaplain will follow up as needed.  01/04/12 1400  Clinical Encounter Type  Visited With Patient and family together  Visit Type Initial;Spiritual support  Referral From Other (Comment) (Frandy Basnett referral)  Spiritual Encounters  Spiritual Needs Emotional  Stress Factors  Patient Stress Factors Major life changes;Health changes  Family Stress Factors Exhausted;Major life changes   Verdie Shire, chaplain resident

## 2012-01-04 NOTE — Progress Notes (Signed)
Dr Lovell Sheehan notified of pt new generalized headache "pounding" in nature. No neuro changes noted. Orders given for AM CT head for eval of possible fluid buildup. Order placed. Will continue to monitor. [place], [place] M

## 2012-01-04 NOTE — Progress Notes (Signed)
Physical Therapy Treatment Patient Details Name: Danielle Riddle MRN: [id number] DOB: [birth year] Today's Date: 01/04/2012 Time: 1332-1410 PT Time Calculation (min): 38 min  PT Assessment / Plan / Recommendation Comments on Treatment Session  Pt able to ambulate this session without RW however highly recommend pt use RW when walking with staff due to weakness and instability.  Pt making progress however noticeable balance impairment and overall fatigue.    Follow Up Recommendations  Home health PT;Supervision/Assistance - 24 hour    Barriers to Discharge        Equipment Recommendations  Rolling walker with 5" wheels    Recommendations for Other Services    Frequency Min 4X/week   Plan Discharge plan remains appropriate;Frequency remains appropriate    Precautions / Restrictions Precautions Precautions: Fall   Pertinent Vitals/Pain Mild headache but does not rate    Mobility  Bed Mobility Bed Mobility: Sit to Supine Sit to Supine: 5: Supervision Details for Bed Mobility Assistance: Supervision for safety Transfers Transfers: Sit to Stand;Stand to Sit Sit to Stand: 4: Min assist;With upper extremity assist;From toilet Stand to Sit: 4: Min assist;With upper extremity assist;To toilet;To bed Details for Transfer Assistance: (A) to initiate transfer;  Pt performed sit<->stand x 5 and needed (A) with transfering without UE assistance. Ambulation/Gait Ambulation/Gait Assistance: 4: Min assist Ambulation Distance (Feet): 175 Feet Assistive device: 1 person hand held assist Ambulation/Gait Assistance Details: (A) to maintain balance and needed intermittent rest breaks and to find midline with balance Gait Pattern: Step-through pattern;Scissoring;Narrow base of support    Exercises General Exercises - Lower Extremity Hip Flexion/Marching: Strengthening;10 reps;Both;Seated Mini-Sqauts: Strengthening;5 reps;Both;Seated   PT Diagnosis:    PT Problem List:   PT Treatment  Interventions:     PT Goals Acute Rehab PT Goals PT Goal Formulation: With patient Time For Goal Achievement: 01/14/12 Potential to Achieve Goals: Good Pt will go Supine/Side to Sit: with supervision PT Goal: Supine/Side to Sit - Progress: Progressing toward goal Pt will go Sit to Supine/Side: with supervision PT Goal: Sit to Supine/Side - Progress: Progressing toward goal Pt will go Sit to Stand: with supervision PT Goal: Sit to Stand - Progress: Progressing toward goal Pt will go Stand to Sit: with supervision PT Goal: Stand to Sit - Progress: Progressing toward goal Pt will Stand: with supervision;3 - 5 min PT Goal: Stand - Progress: Progressing toward goal Pt will Ambulate: >150 feet;with supervision;with least restrictive assistive device PT Goal: Ambulate - Progress: Progressing toward goal  Visit Information  Last PT Received On: 01/04/12 Assistance Needed: +1    Subjective Data      Cognition  Overall Cognitive Status: Appears within functional limits for tasks assessed/performed    Balance  Balance Balance Assessed: No Static Sitting Balance Static Sitting - Balance Support: Feet supported Static Sitting - Level of Assistance: 5: Stand by assistance Static Standing Balance Static Standing - Balance Support: No upper extremity supported Static Standing - Level of Assistance: 5: Stand by assistance Static Standing - Comment/# of Minutes: 2 minutes with noticeable lateral sway Single Leg Stance - Right Leg: 15  (sec) Single Leg Stance - Left Leg: 10  (sec) Tandem Stance - Right Leg: 30  (sec) Tandem Stance - Left Leg: 30  Rhomberg - Eyes Opened: 30  (sec) Rhomberg - Eyes Closed: 15   End of Session PT - End of Session Equipment Utilized During Treatment: Gait belt Activity Tolerance: Patient tolerated treatment well Patient left: in bed;with call bell/phone within reach;with family/visitor present  Nurse Communication: Mobility status     Chandi Nicklin 01/04/2012, 3:07 PM Jake Shark, PT DPT [phone]

## 2012-01-05 ENCOUNTER — Inpatient Hospital Stay (HOSPITAL_COMMUNITY): Payer: PRIVATE HEALTH INSURANCE

## 2012-01-05 LAB — BASIC METABOLIC PANEL
Chloride: 95 mEq/L — ABNORMAL LOW (ref 96–112)
GFR calc Af Amer: >90 mL/min (ref >90)
Potassium: 4.2 mEq/L (ref 3.5–5.1)
Sodium: 136 mEq/L (ref 135–145)

## 2012-01-05 MED ORDER — GUAIFENESIN ER 600 MG PO TB12
600.0000 mg | ORAL_TABLET | Freq: Two times a day (BID) | ORAL | Status: DC
  Administered 2012-01-05 – 2012-01-06 (×4): 600 mg via ORAL
  Filled 2012-01-05 (×6): qty 1

## 2012-01-05 NOTE — Progress Notes (Signed)
Subjective: Patient resting in bed more comfortably. Overall less headache. Has been ambulating about twice a day in ICU.  Objective: Vital signs in last 24 hours: Filed Vitals:   01/05/12 0900 01/05/12 1100 01/05/12 1200 01/05/12 1300  BP: 101/54 96/50 96/54  92/57  Pulse:      Temp:      TempSrc:      Resp: 11 12 12 6   Height:      Weight:      SpO2:   93%     Intake/Output from previous day: 05/22 0701 - 05/23 0700 In: 1606.7 [P.O.:1440; I.V.:166.7] Out: 4750 [Urine:4750] Intake/Output this shift: Total I/O In: 242 [P.O.:240; IV Piggyback:2] Out: 500 [Urine:500]  Physical Exam:  Awake and alert, oriented. Pupils equal round and reactive to light, and about 1.5 mm bilaterally. Extra ocular movements intact. Facial movements symmetrical. Tongue midline. Good strength in all 4 extremities. No drift of upper extremities. No meningismus.  BMET  Basename 01/05/12 0335 01/04/12 0330  NA 136 138  K 4.2 4.1  CL 95* 97  CO2 35* 32  GLUCOSE 121* 189*  BUN 11 12  CREATININE 0.49* 0.52  CALCIUM 9.2 9.3    Studies/Results: Ct Head Wo Contrast  01/05/2012  *RADIOLOGY REPORT*  Clinical Data: Ventricular drain removal.  Subarachnoid hemorrhage  CT HEAD WITHOUT CONTRAST  Technique:  Contiguous axial images were obtained from the base of the skull through the vertex without contrast.  Comparison: 12/31/2011  Findings: Right frontal ventricular drain has been removed. Interval development of a 4 mm right frontal extra-axial fluid collection.  This is relatively low density but slightly higher density than CSF.  There is a small amount of pneumocephalus frontally on the right.  Slight increase in midline shift compared with the prior study which now measures 4.4 mm.  Ventricles are not enlarged.  Subarachnoid hemorrhage has nearly completely resolved.  No new hemorrhage.  No acute infarct or mass.  Air-fluid level left maxillary sinus is unchanged.  IMPRESSION: Interval removal of right  frontal ventricular drain with development of a low density subdural fluid collection on the right.  There is mild subdural gas present.  There is increase in midline shift, now 4.4 mm.  Negative for hydrocephalus.  No new hemorrhage.  Original Report Authenticated By: Camelia Phenes, M.D.    Assessment/Plan: CT this morning shows no hydrocephalus. Continues to have large airway congestion, but CT shows no opacification of sinuses. Neurologically improved as compared to my last evaluation 1 week ago. Have encouraged patient to ambulate 4 times per day in ICU. Patient is asked about a shower, order written. We'll check TCD tomorrow.   Hewitt Shorts, MD 01/05/2012, 2:23 PM

## 2012-01-05 NOTE — Progress Notes (Signed)
TENIYAH SEIVERT is a 66 y.o. female admitted on 12/16/2011 with headache, n/v, photophobia, and confusion.  Found to have SAH.  Had tx for asthma exacerbation, and sinusitis prior to admit (Followed by Dr. Shelle Iron as outpt).  PCCM consulted 5/3 for medical management while in ICU.  Line/tube: Rt ventricular drain 5/3>>>  Cultures: MRSA screen 5/3>>negative  Antibiotics: Zithromax 5/1>>5/4 Rocephin 5/4>>  Tests/events: 5/3 CT head>>SAH, no hydrocephalus 5/5 CT head>>decrease in [facility], no change in IVH, acute on chronic pansinusitis 5/12 CT head>>>evidence vasospasm 5/14- line placed, pos balance with fluid resuscitation, MAP met higher goals 5/15 - Vasospasm on TCD vert remains elevated 5/15- clinical improvement in headache, pos 2.5 liters successful without hypoxia 5/17 CT head . Sinus- Normalization of ventricular size.<BR>2. Minimal residual subarachnoid and intraventricular blood, products continue improve.<3. No new hemorrhage or acute abnormality.<BR>4. Sinus disease has improved since the prior study as well 5/20 TCD Elevated left middle cerebral mean flow velocities suggest early mild vasospasm. Improvement in bilateral vertebral vasospasm with now normal mean flow velocities cpmpared with TCD 12/30/11. 5/22  Ventriculostomy / CVL d/c'd  SUBJECTIVE: Headache improved as compared to last night. Cough / upper airway secretions.  No dyspnea.  OBJECTIVE:  Blood pressure 96/54, pulse 78, temperature 98.6 F (37 C), temperature source Oral, resp. rate 12, height 5\' 6"  (1.676 m), weight 75.4 kg (166 lb 3.6 oz), SpO2 93.00%. Wt Readings from Last 3 Encounters:  01/02/12 75.4 kg (166 lb 3.6 oz)  12/14/11 64.955 kg (143 lb 3.2 oz)  09/20/11 65.046 kg (143 lb 6.4 oz)   Body mass index is 26.83 kg/(m^2).  I/O last 3 completed shifts: In: 3586.7 [P.O.:2520; I.V.:1066.7] Out: 8542 [Urine:8500; Drains:42]  Physical Exam: General - No distress HEENT - CVL has been removed Cardiac -  regular, nomurmurs Chest - bilateral air entry, few upper airway rhonchi, no wheezing Abd - soft, nontender, no r/g Ext - no edema Neuro - awake, normal strength all ext, chair, she walked th icu   Lab 01/05/12 0335 01/04/12 0330 01/03/12 0433  NA 136 138 141  K 4.2 4.1 4.4  CL 95* 97 101  CO2 35* 32 33*  BUN 11 12 10   CREATININE 0.49* 0.52 0.48*  GLUCOSE 121* 189* 125*    Lab 01/01/12 0209  HGB 12.0  HCT 35.7*  WBC 10.5  PLT 303    ASSESSMENT/PLAN:  Subarachnoid hemorrhage. Ventriculostomy d/c'd 5/23. Likely out of the time period for vasospasm.  Headache, likely exacerbated by cough. plan - dc any further tcd, no role to guide management further - continue Zocor, stop date 5/29 (14 days) - PT , ambulation - D/c Depakote today (completed 21 days) - Nimodipine, may extend past 21 days with vert velocity - Xanax  Hyponatremia likely secondary to CSW, resolved Plan: - IVF KVO - BMP in AM  Acute on chronic sinusitis, resolved - Flonase - Claritine  Asthma with persistent cough, no acute bronchospasm.  Upper airway secretions. - start flutter valve - start Mucinex  - bronchodilators PRN  Constipation likely from narcotic use -senna, BM daily  Steroid induced hyperglycemia, resolved - no intervention needed at this time  Best practice -SCD and ambulation for DVT prophylaxis -Protonix for SUP, still required  Orlean Bradford, M.D., F.C.C.P. Pulmonary and Critical Care Medicine [facility] Cell: [phone] Pager: [phone]

## 2012-01-05 NOTE — Progress Notes (Signed)
Physical Therapy Treatment Patient Details Name: Danielle Riddle MRN: [id number] DOB: 07-26-1946 Today's Date: 01/05/2012 Time: 1415-1440 PT Time Calculation (min): 25 min  PT Assessment / Plan / Recommendation Comments on Treatment Session  Pt able to increase ambulation overall and perform stair negotiation.  Pt continues to be slightly off balance with ambulation during turns.    Follow Up Recommendations  Home health PT;Supervision/Assistance - 24 hour    Barriers to Discharge        Equipment Recommendations  Rolling walker with 5" wheels    Recommendations for Other Services    Frequency Min 4X/week   Plan Discharge plan remains appropriate;Frequency remains appropriate    Precautions / Restrictions Precautions Precautions: Fall   Pertinent Vitals/Pain C/o intermittent headache but does not rate    Mobility  Transfers Transfers: Sit to Stand;Stand to Sit Sit to Stand: 4: Min guard;From chair/3-in-1 Stand to Sit: 4: Min guard;To chair/3-in-1 Details for Transfer Assistance: Minguard for safety with cues for hand placement. Ambulation/Gait Ambulation/Gait Assistance: 4: Min guard Ambulation Distance (Feet): 300 Feet Assistive device: Rolling walker Ambulation/Gait Assistance Details: Pt able to ambulate 200' with RW and 100' without RW.  Minguard for safety with LOB during turns. Gait Pattern: Step-through pattern;Scissoring;Narrow base of support Stairs: Yes Stairs Assistance: 4: Min assist Stairs Assistance Details (indicate cue type and reason): (A) to maintain balance with cues for step sequence and hand placement Stair Management Technique: One rail Right;Step to pattern;Forwards Number of Stairs: 3     Exercises General Exercises - Lower Extremity Mini-Sqauts: Strengthening;5 reps;Both;Seated   PT Diagnosis:    PT Problem List:   PT Treatment Interventions:     PT Goals Acute Rehab PT Goals PT Goal Formulation: With patient Time For Goal Achievement:  01/14/12 Potential to Achieve Goals: Good Pt will go Sit to Supine/Side: with supervision PT Goal: Sit to Supine/Side - Progress: Progressing toward goal Pt will go Sit to Stand: with supervision PT Goal: Sit to Stand - Progress: Progressing toward goal Pt will go Stand to Sit: with supervision PT Goal: Stand to Sit - Progress: Progressing toward goal Pt will Stand: with supervision;3 - 5 min PT Goal: Stand - Progress: Progressing toward goal Pt will Ambulate: >150 feet;with supervision;with least restrictive assistive device PT Goal: Ambulate - Progress: Progressing toward goal Pt will Go Up / Down Stairs: 3-5 stairs;with min assist;with least restrictive assistive device PT Goal: Up/Down Stairs - Progress: Progressing toward goal  Visit Information  Last PT Received On: 01/05/12 Assistance Needed: +1    Subjective Data      Cognition  Overall Cognitive Status: Appears within functional limits for tasks assessed/performed    Balance  Static Standing Balance Static Standing - Balance Support: No upper extremity supported Static Standing - Level of Assistance: 5: Stand by assistance Static Standing - Comment/# of Minutes: ~15 seconds x 5 during mini squats  End of Session PT - End of Session Equipment Utilized During Treatment: Gait belt Activity Tolerance: Patient tolerated treatment well Patient left: in bed;with call bell/phone within reach;with family/visitor present (sitting EOB) Nurse Communication: Mobility status    Allisyn Kunz 01/05/2012, 2:56 PM Jake Shark, PT DPT [phone]

## 2012-01-06 MED ORDER — OXYCODONE HCL 5 MG PO TABS
5.0000 mg | ORAL_TABLET | ORAL | Status: DC | PRN
  Administered 2012-01-06 (×2): 5 mg via ORAL
  Filled 2012-01-06: qty 2

## 2012-01-06 MED ORDER — OXYCODONE HCL 10 MG PO TB12
10.0000 mg | ORAL_TABLET | Freq: Two times a day (BID) | ORAL | Status: DC
  Administered 2012-01-06 (×2): 10 mg via ORAL
  Filled 2012-01-06 (×3): qty 1

## 2012-01-06 MED ORDER — ALPRAZOLAM 0.25 MG PO TABS: 0.2500 mg | ORAL_TABLET | Freq: Two times a day (BID) | ORAL | Status: DC | PRN

## 2012-01-06 MED ORDER — OXYCODONE HCL 5 MG PO TABS
10.0000 mg | ORAL_TABLET | ORAL | Status: DC | PRN
  Administered 2012-01-06 – 2012-01-07 (×4): 10 mg via ORAL
  Filled 2012-01-06 (×4): qty 2

## 2012-01-06 NOTE — Progress Notes (Signed)
eLink Physician-Brief Progress Note Patient Name: JAMIE-LEE GALDAMEZ DOB: 10/27/1945 MRN: [id number]  Date of Service  01/06/2012   HPI/Events of Note   Not getting sufficient pain meds  eICU Interventions  See orders for adjusted pain program   Intervention Category Intermediate Interventions: Pain - evaluation and management  Shan Levans 01/06/2012, 6:18 PM

## 2012-01-06 NOTE — Progress Notes (Signed)
Subjective: Patient sitting up in chair, comfortable. Not complaining of headache. Has been increasing ambulation, and gradually recovering stability.   Objective: Vital signs in last 24 hours: Filed Vitals:   01/05/12 1958 01/05/12 2100 01/06/12 0000 01/06/12 0400  BP:  105/74    Pulse:  70    Temp: 98.7 F (37.1 C)  98.2 F (36.8 C) 98.1 F (36.7 C)  TempSrc: Oral  Oral Oral  Resp:      Height:      Weight:      SpO2:  96%      Intake/Output from previous day: 05/23 0701 - 05/24 0700 In: 1082 [P.O.:1080; IV Piggyback:2] Out: 3700 [Urine:3700] Intake/Output this shift:    Physical Exam:  Awake alert, oriented. Neck supple. Moving all 4 extremities well.  BMET  Basename 01/05/12 0335 01/04/12 0330  NA 136 138  K 4.2 4.1  CL 95* 97  CO2 35* 32  GLUCOSE 121* 189*  BUN 11 12  CREATININE 0.49* 0.52  CALCIUM 9.2 9.3    Studies/Results: Ct Head Wo Contrast  01/05/2012  *RADIOLOGY REPORT*  Clinical Data: Ventricular drain removal.  Subarachnoid hemorrhage  CT HEAD WITHOUT CONTRAST  Technique:  Contiguous axial images were obtained from the base of the skull through the vertex without contrast.  Comparison: 12/31/2011  Findings: Right frontal ventricular drain has been removed. Interval development of a 4 mm right frontal extra-axial fluid collection.  This is relatively low density but slightly higher density than CSF.  There is a small amount of pneumocephalus frontally on the right.  Slight increase in midline shift compared with the prior study which now measures 4.4 mm.  Ventricles are not enlarged.  Subarachnoid hemorrhage has nearly completely resolved.  No new hemorrhage.  No acute infarct or mass.  Air-fluid level left maxillary sinus is unchanged.  IMPRESSION: Interval removal of right frontal ventricular drain with development of a low density subdural fluid collection on the right.  There is mild subdural gas present.  There is increase in midline shift, now 4.4 mm.   Negative for hydrocephalus.  No new hemorrhage.  Original Report Authenticated By: Camelia Phenes, M.D.    Assessment/Plan: Encouraged to increase ambulation, without assistive devices. We'll change Xanax to when necessary, and monitor how patient does. Depakote finished, we'll monitor how patient does without it. Have asked patient and her husband, as well as her nurse to discuss with CCM what her outpatient pulmonary regimen will need to be.    Hewitt Shorts, MD 01/06/2012, 8:01 AM

## 2012-01-06 NOTE — Progress Notes (Signed)
Danielle Riddle is a 66 y.o. female admitted on 12/16/2011 with headache, n/v, photophobia, and confusion.  Found to have SAH.  Had tx for asthma exacerbation, and sinusitis prior to admit (Followed by Dr. Shelle Iron as outpt).  PCCM consulted 5/3 for medical management while in ICU.  Line/tube: Rt ventricular drain 5/3>>>  Cultures: MRSA screen 5/3>>negative  Antibiotics: Zithromax 5/1>>5/4 Rocephin 5/4>>  Tests/events: 5/3 CT head>>SAH, no hydrocephalus 5/5 CT head>>decrease in [facility], no change in IVH, acute on chronic pansinusitis 5/12 CT head>>>evidence vasospasm 5/14- line placed, pos balance with fluid resuscitation, MAP met higher goals 5/15 - Vasospasm on TCD vert remains elevated 5/15- clinical improvement in headache, pos 2.5 liters successful without hypoxia 5/17 CT head . Sinus- Normalization of ventricular size.<BR>2. Minimal residual subarachnoid and intraventricular blood, products continue improve.<3. No new hemorrhage or acute abnormality.<BR>4. Sinus disease has improved since the prior study as well 5/20 TCD Elevated left middle cerebral mean flow velocities suggest early mild vasospasm. Improvement in bilateral vertebral vasospasm with now normal mean flow velocities cpmpared with TCD 12/30/11. 5/22  Ventriculostomy / CVL d/c'd  SUBJECTIVE: Tired but headache is better.  Cough persists, but better secretion clearance with flutter valve.  OBJECTIVE:  Blood pressure 105/54, pulse 72, temperature 98.1 F (36.7 C), temperature source Oral, resp. rate 14, height 5\' 6"  (1.676 m), weight 75.4 kg (166 lb 3.6 oz), SpO2 96.00%. Wt Readings from Last 3 Encounters:  01/02/12 75.4 kg (166 lb 3.6 oz)  12/14/11 64.955 kg (143 lb 3.2 oz)  09/20/11 65.046 kg (143 lb 6.4 oz)   Body mass index is 26.83 kg/(m^2).  I/O last 3 completed shifts: In: 1924 [P.O.:1920; IV Piggyback:4] Out: 5900 [Urine:5900]  Physical Exam: General - No distress HEENT - CVL has been removed Cardiac -  regular, nomurmurs Chest - bilateral air entry, few upper airway rhonchi, no wheezing Abd - soft, nontender, no r/g Ext - no edema Neuro - awake, normal strength all ext, chair, she walked th icu   Lab 01/05/12 0335 01/04/12 0330 01/03/12 0433  NA 136 138 141  K 4.2 4.1 4.4  CL 95* 97 101  CO2 35* 32 33*  BUN 11 12 10   CREATININE 0.49* 0.52 0.48*  GLUCOSE 121* 189* 125*    Lab 01/01/12 0209  HGB 12.0  HCT 35.7*  WBC 10.5  PLT 303    ASSESSMENT/PLAN:  Subarachnoid hemorrhage. Ventriculostomy d/c'd 5/23. Likely out of the time period for vasospasm.  Headache, likely exacerbated by cough. Depakote d/c'd 5/23, Nimodipine d/c'd 5/24. plan - continue Zocor, stop date 5/29 (14 days) - PT , ambulation - Xanax - Pain medication changed to OxyContin + OxyCodone  Hyponatremia likely secondary to CSW, resolved Plan: - IVF KVO  Acute on chronic sinusitis, resolved - Flonase - Claritine  Asthma with persistent cough, no acute bronchospasm.  Upper airway secretions. - flutter valve - Mucinex  - bronchodilators PRN  Constipation likely from narcotic use -senna, BM daily  Steroid induced hyperglycemia, resolved - no intervention needed at this time  Best practice -SCD and ambulation for DVT prophylaxis -Protonix for SUP, still required  Orlean Bradford, M.D., F.C.C.P. Pulmonary and Critical Care Medicine [facility] Cell: [phone] Pager: [phone]

## 2012-01-06 NOTE — Progress Notes (Signed)
Physical Therapy Treatment Patient Details Name: Danielle Riddle MRN: [id number] DOB: 05/26/1946 Today's Date: 01/06/2012 Time: 0102-7253 PT Time Calculation (min): 22 min  PT Assessment / Plan / Recommendation Comments on Treatment Session  Pt making great improvement in ambulation and stairs. Pt still requires supervision/min guard for safety due to decreased balance. Spoke with pt regarding safety at home upon d/c    Follow Up Recommendations  Home health PT;Supervision/Assistance - 24 hour    Barriers to Discharge        Equipment Recommendations  Rolling walker with 5" wheels    Recommendations for Other Services    Frequency Min 4X/week   Plan Discharge plan remains appropriate;Frequency remains appropriate    Precautions / Restrictions Precautions Precautions: Fall Restrictions Weight Bearing Restrictions: No       Mobility  Bed Mobility Bed Mobility: Not assessed Transfers Transfers: Sit to Stand;Stand to Sit Sit to Stand: 5: Supervision;With upper extremity assist;From chair/3-in-1;From toilet Stand to Sit: 5: Supervision;With upper extremity assist;To toilet Details for Transfer Assistance: Supervision for safety. Cueing for hand placement and anterior translation Ambulation/Gait Ambulation/Gait Assistance: 5: Supervision;4: Min guard Ambulation Distance (Feet): 400 Feet Assistive device: None;1 person hand held assist Ambulation/Gait Assistance Details: 1 person hand held assist for first loop with minguard assist, second loop supervision only with no assistive device or hand held assist. Spoke with pt and husband regarding use of RW at home for safety  Gait Pattern: Step-to pattern Gait velocity: decreased gait speed Stairs: Yes Stairs Assistance: 5: Supervision Stairs Assistance Details (indicate cue type and reason): VC for proper sequencing. Supervision for safety Stair Management Technique: One rail Right;Alternating pattern;Step to  pattern;Forwards Number of Stairs: 10  Wheelchair Mobility Wheelchair Mobility: No    Exercises     PT Diagnosis:    PT Problem List:   PT Treatment Interventions:     PT Goals Acute Rehab PT Goals PT Goal Formulation: With patient PT Goal: Sit to Stand - Progress: Met PT Goal: Stand to Sit - Progress: Met PT Goal: Stand - Progress: Met PT Goal: Ambulate - Progress: Progressing toward goal PT Goal: Up/Down Stairs - Progress: Met  Visit Information  Last PT Received On: 01/06/12 Assistance Needed: +1    Subjective Data      Cognition  Overall Cognitive Status: Appears within functional limits for tasks assessed/performed    Balance  Balance Balance Assessed: No  End of Session PT - End of Session Equipment Utilized During Treatment: Gait belt Activity Tolerance: Patient tolerated treatment well Patient left: in chair;with call bell/phone within reach;with family/visitor present Nurse Communication: Mobility status    Milana Kidney 01/06/2012, 10:52 AM  01/06/2012 Milana Kidney DPT PAGER: [phone] OFFICE: [phone]

## 2012-01-06 NOTE — Progress Notes (Signed)
TCD completed.  Preliminary results reported with 12/16/11.

## 2012-01-07 MED ORDER — AZITHROMYCIN 250 MG PO TABS: ORAL_TABLET | ORAL | Status: DC

## 2012-01-07 MED ORDER — HYDROCODONE-HOMATROPINE 5-1.5 MG/5ML PO SYRP: 5.0000 mL | ORAL_SOLUTION | Freq: Four times a day (QID) | ORAL | Status: DC | PRN

## 2012-01-07 MED ORDER — MOMETASONE FUROATE 220 MCG/INH IN AEPB: 3.0000 | INHALATION_SPRAY | Freq: Every day | RESPIRATORY_TRACT | Status: DC

## 2012-01-07 MED ORDER — PANTOPRAZOLE SODIUM 40 MG PO TBEC: 40.0000 mg | DELAYED_RELEASE_TABLET | Freq: Every day | ORAL | Status: DC

## 2012-01-07 MED ORDER — MORPHINE SULFATE 2 MG/ML IJ SOLN
1.0000 mg | INTRAMUSCULAR | Status: DC | PRN
  Administered 2012-01-07: 2 mg via INTRAVENOUS
  Filled 2012-01-07: qty 1

## 2012-01-07 MED ORDER — PREDNISONE 10 MG PO TABS: ORAL_TABLET | ORAL | Status: DC

## 2012-01-07 MED ORDER — FLUTICASONE PROPIONATE 50 MCG/ACT NA SUSP: 2.0000 | Freq: Every day | NASAL | Status: DC

## 2012-01-07 NOTE — Discharge Instructions (Signed)
STROKE/TIA DISCHARGE INSTRUCTIONS SMOKING Cigarette smoking nearly doubles your risk of having a stroke & is the single most alterable risk factor  If you smoke or have smoked in the last 12 months, you are advised to quit smoking for your health.  Most of the excess cardiovascular risk related to smoking disappears within a year of stopping.  Ask you doctor about anti-smoking medications  [facility] Quit Line: 1-800-QUIT NOW  Free Smoking Cessation Classes ([phone]  CHOLESTEROL Know your levels; limit fat & cholesterol in your diet  Lipid Panel  No results found for this basename: chol, trig, hdl, cholhdl, vldl, ldlcalc      Many patients benefit from treatment even if their cholesterol is at goal.  Goal: Total Cholesterol (CHOL) less than 160  Goal:  Triglycerides (TRIG) less than 150  Goal:  HDL greater than 40  Goal:  LDL (LDLCALC) less than 100   BLOOD PRESSURE American Stroke Association blood pressure target is less that 120/80 mm/Hg  Your discharge blood pressure is:  BP: 130/95 mmHg  Monitor your blood pressure  Limit your salt and alcohol intake  Many individuals will require more than one medication for high blood pressure  DIABETES (A1c is a blood sugar average for last 3 months) Goal HGBA1c is under 7% (HBGA1c is blood sugar average for last 3 months)  Diabetes: {STROKE DC DIABETES:22357}    No results found for this basename: HGBA1C     Your HGBA1c can be lowered with medications, healthy diet, and exercise.  Check your blood sugar as directed by your physician  Call your physician if you experience unexplained or low blood sugars.  PHYSICAL ACTIVITY/REHABILITATION Goal is 30 minutes at least 4 days per week    {STROKE DC ACTIVITY/REHAB:22359}  Activity decreases your risk of heart attack and stroke and makes your heart stronger.  It helps control your weight and blood pressure; helps you relax and can improve your mood.  Participate in a regular exercise  program.  Talk with your doctor about the best form of exercise for you (dancing, walking, swimming, cycling).  DIET/WEIGHT Goal is to maintain a healthy weight  Your discharge diet is: Clear Liquid *** liquids Your height is:  Height: 5\' 6"  (167.6 cm) Your current weight is: Weight: 68.7 kg (151 lb 7.3 oz) Your Body Mass Index (BMI) is:  BMI (Calculated): 22.6   Following the type of diet specifically designed for you will help prevent another stroke.  Your goal weight range is:  ***  Your goal Body Mass Index (BMI) is 19-24.  Healthy food habits can help reduce 3 risk factors for stroke:  High cholesterol, hypertension, and excess weight.  RESOURCES Stroke/Support Group:  Call [phone]  they meet the 3rd Sunday of the month on the Rehab Unit at [facility], [place] ( no meetings June, July & Aug).  STROKE EDUCATION PROVIDED/REVIEWED AND GIVEN TO PATIENT Stroke warning signs and symptoms How to activate emergency medical system (call 911). Medications prescribed at discharge. Need for follow-up after discharge. Personal risk factors for stroke. Pneumonia vaccine given:   {STROKE DC YES/NO/DATE:22363} Flu vaccine given:   {STROKE DC YES/NO/DATE:22363} My questions have been answered, the writing is legible, and I understand these instructions.  I will adhere to these goals & educational materials that have been provided to me after my discharge from the hospital.

## 2012-01-07 NOTE — Discharge Summary (Signed)
Physician Discharge Summary  Patient ID: Danielle Riddle MRN: [id number] DOB/AGE: Dec 02, 1945 66 y.o.  Admit date: 12/16/2011 Discharge date: 01/07/2012  Admission Diagnoses: [facility]    Discharge Diagnoses: same   Discharged Condition: good  Hospital Course: The patient was admitted on 12/16/2011 with subarachnoid hemorrhage. Arteriogram was negative. 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 She had a fairly long protracted course Manning in the hospital for 3 weeks. AP arteriogram showed no evidence of aneurysm. She continued to improve over time. She can have headaches but was able to ambulate and had a nonfocal neurologic exam. The patient remained afebrile with stable vital signs, and tolerated a regular diet. The patient continued to increase activities, and pain was well controlled with oral pain medications. She was very eager for discharge and was discharged to home with plans to follow up in a few days with Dr. Newell Coral.  Consults: pulmonary/intensive care  Significant Diagnostic Studies:  Results for orders placed during the hospital encounter of 12/16/11  MRSA PCR SCREENING      Component Value Range   MRSA by PCR NEGATIVE  NEGATIVE   COMPREHENSIVE METABOLIC PANEL      Component Value Range   Sodium 137  135 - 145 (mEq/L)   Potassium 3.4 (*) 3.5 - 5.1 (mEq/L)   Chloride 99  96 - 112 (mEq/L)   CO2 30  19 - 32 (mEq/L)   Glucose, Bld 194 (*) 70 - 99 (mg/dL)   BUN 17  6 - 23 (mg/dL)   Creatinine, Ser 6.57  0.50 - 1.10 (mg/dL)   Calcium 8.8  8.4 - 84.6 (mg/dL)   Total Protein 6.8  6.0 - 8.3 (g/dL)   Albumin 3.6  3.5 - 5.2 (g/dL)   AST 29  0 - 37 (U/L)   ALT 15  0 - 35 (U/L)   Alkaline Phosphatase 56  39 - 117 (U/L)   Total Bilirubin 0.1 (*) 0.3 - 1.2 (mg/dL)   GFR calc non Af Amer >90  >90 (mL/min)   GFR calc Af Amer >90  >90 (mL/min)  MAGNESIUM      Component Value Range   Magnesium 2.0  1.5 - 2.5 (mg/dL)  PHOSPHORUS      Component Value  Range   Phosphorus 3.8  2.3 - 4.6 (mg/dL)  CBC      Component Value Range   WBC 8.5  4.0 - 10.5 (K/uL)   RBC 3.94  3.87 - 5.11 (MIL/uL)   Hemoglobin 12.4  12.0 - 15.0 (g/dL)   HCT 96.2  95.2 - 84.1 (%)   MCV 94.4  78.0 - 100.0 (fL)   MCH 31.5  26.0 - 34.0 (pg)   MCHC 33.3  30.0 - 36.0 (g/dL)   RDW 32.4  40.1 - 02.7 (%)   Platelets 170  150 - 400 (K/uL)  DIFFERENTIAL      Component Value Range   Neutrophils Relative 78 (*) 43 - 77 (%)   Neutro Abs 6.6  1.7 - 7.7 (K/uL)   Lymphocytes Relative 12  12 - 46 (%)   Lymphs Abs 1.0  0.7 - 4.0 (K/uL)   Monocytes Relative 10  3 - 12 (%)   Monocytes Absolute 0.8  0.1 - 1.0 (K/uL)   Eosinophils Relative 0  0 - 5 (%)   Eosinophils Absolute 0.0  0.0 - 0.7 (K/uL)   Basophils Relative 0  0 - 1 (%)   Basophils Absolute 0.0  0.0 - 0.1 (K/uL)  PROTIME-INR  Component Value Range   Prothrombin Time 13.7  11.6 - 15.2 (seconds)   INR 1.03  0.00 - 1.49   APTT      Component Value Range   aPTT 25  24 - 37 (seconds)  GLUCOSE, CAPILLARY      Component Value Range   Glucose-Capillary 185 (*) 70 - 99 (mg/dL)  GLUCOSE, CAPILLARY      Component Value Range   Glucose-Capillary 157 (*) 70 - 99 (mg/dL)  GLUCOSE, CAPILLARY      Component Value Range   Glucose-Capillary 144 (*) 70 - 99 (mg/dL)  GLUCOSE, CAPILLARY      Component Value Range   Glucose-Capillary 141 (*) 70 - 99 (mg/dL)   Comment 1 Notify RN     Comment 2 Documented in Chart    GLUCOSE, CAPILLARY      Component Value Range   Glucose-Capillary 148 (*) 70 - 99 (mg/dL)   Comment 1 Notify RN     Comment 2 Documented in Chart    GLUCOSE, CAPILLARY      Component Value Range   Glucose-Capillary 126 (*) 70 - 99 (mg/dL)  BASIC METABOLIC PANEL      Component Value Range   Sodium 132 (*) 135 - 145 (mEq/L)   Potassium 3.3 (*) 3.5 - 5.1 (mEq/L)   Chloride 99  96 - 112 (mEq/L)   CO2 27  19 - 32 (mEq/L)   Glucose, Bld 110 (*) 70 - 99 (mg/dL)   BUN 10  6 - 23 (mg/dL)   Creatinine, Ser 4.09   0.50 - 1.10 (mg/dL)   Calcium 8.3 (*) 8.4 - 10.5 (mg/dL)   GFR calc non Af Amer >90  >90 (mL/min)   GFR calc Af Amer >90  >90 (mL/min)  CBC      Component Value Range   WBC 5.1  4.0 - 10.5 (K/uL)   RBC 3.75 (*) 3.87 - 5.11 (MIL/uL)   Hemoglobin 11.8 (*) 12.0 - 15.0 (g/dL)   HCT 81.1 (*) 91.4 - 46.0 (%)   MCV 92.3  78.0 - 100.0 (fL)   MCH 31.5  26.0 - 34.0 (pg)   MCHC 34.1  30.0 - 36.0 (g/dL)   RDW 78.2  95.6 - 21.3 (%)   Platelets 186  150 - 400 (K/uL)  GLUCOSE, CAPILLARY      Component Value Range   Glucose-Capillary 150 (*) 70 - 99 (mg/dL)  GLUCOSE, CAPILLARY      Component Value Range   Glucose-Capillary 165 (*) 70 - 99 (mg/dL)  BASIC METABOLIC PANEL      Component Value Range   Sodium 136  135 - 145 (mEq/L)   Potassium 3.4 (*) 3.5 - 5.1 (mEq/L)   Chloride 104  96 - 112 (mEq/L)   CO2 23  19 - 32 (mEq/L)   Glucose, Bld 108 (*) 70 - 99 (mg/dL)   BUN 7  6 - 23 (mg/dL)   Creatinine, Ser 0.86  0.50 - 1.10 (mg/dL)   Calcium 8.7  8.4 - 57.8 (mg/dL)   GFR calc non Af Amer >90  >90 (mL/min)   GFR calc Af Amer >90  >90 (mL/min)  BASIC METABOLIC PANEL      Component Value Range   Sodium 136  135 - 145 (mEq/L)   Potassium 3.6  3.5 - 5.1 (mEq/L)   Chloride 101  96 - 112 (mEq/L)   CO2 28  19 - 32 (mEq/L)   Glucose, Bld 131 (*) 70 - 99 (mg/dL)  BUN 6  6 - 23 (mg/dL)   Creatinine, Ser 1.61  0.50 - 1.10 (mg/dL)   Calcium 9.1  8.4 - 09.6 (mg/dL)   GFR calc non Af Amer >90  >90 (mL/min)   GFR calc Af Amer >90  >90 (mL/min)  MAGNESIUM      Component Value Range   Magnesium 2.0  1.5 - 2.5 (mg/dL)  GLUCOSE, CAPILLARY      Component Value Range   Glucose-Capillary 135 (*) 70 - 99 (mg/dL)   Comment 1 Notify RN     Comment 2 Documented in Chart    GLUCOSE, CAPILLARY      Component Value Range   Glucose-Capillary 148 (*) 70 - 99 (mg/dL)  BASIC METABOLIC PANEL      Component Value Range   Sodium 134 (*) 135 - 145 (mEq/L)   Potassium 3.4 (*) 3.5 - 5.1 (mEq/L)   Chloride 101  96 -  112 (mEq/L)   CO2 26  19 - 32 (mEq/L)   Glucose, Bld 131 (*) 70 - 99 (mg/dL)   BUN 9  6 - 23 (mg/dL)   Creatinine, Ser 0.45  0.50 - 1.10 (mg/dL)   Calcium 8.6  8.4 - 40.9 (mg/dL)   GFR calc non Af Amer >90  >90 (mL/min)   GFR calc Af Amer >90  >90 (mL/min)  CBC      Component Value Range   WBC 7.5  4.0 - 10.5 (K/uL)   RBC 3.78 (*) 3.87 - 5.11 (MIL/uL)   Hemoglobin 12.0  12.0 - 15.0 (g/dL)   HCT 81.1 (*) 91.4 - 46.0 (%)   MCV 90.2  78.0 - 100.0 (fL)   MCH 31.7  26.0 - 34.0 (pg)   MCHC 35.2  30.0 - 36.0 (g/dL)   RDW 78.2  95.6 - 21.3 (%)   Platelets 245  150 - 400 (K/uL)  GLUCOSE, CAPILLARY      Component Value Range   Glucose-Capillary 136 (*) 70 - 99 (mg/dL)   Comment 1 Notify RN     Comment 2 Documented in Chart    BASIC METABOLIC PANEL      Component Value Range   Sodium 137  135 - 145 (mEq/L)   Potassium 4.3  3.5 - 5.1 (mEq/L)   Chloride 102  96 - 112 (mEq/L)   CO2 28  19 - 32 (mEq/L)   Glucose, Bld 129 (*) 70 - 99 (mg/dL)   BUN 7  6 - 23 (mg/dL)   Creatinine, Ser 0.86  0.50 - 1.10 (mg/dL)   Calcium 9.2  8.4 - 57.8 (mg/dL)   GFR calc non Af Amer >90  >90 (mL/min)   GFR calc Af Amer >90  >90 (mL/min)  BASIC METABOLIC PANEL      Component Value Range   Sodium 134 (*) 135 - 145 (mEq/L)   Potassium 4.4  3.5 - 5.1 (mEq/L)   Chloride 99  96 - 112 (mEq/L)   CO2 25  19 - 32 (mEq/L)   Glucose, Bld 147 (*) 70 - 99 (mg/dL)   BUN 9  6 - 23 (mg/dL)   Creatinine, Ser 4.69 (*) 0.50 - 1.10 (mg/dL)   Calcium 9.7  8.4 - 62.9 (mg/dL)   GFR calc non Af Amer >90  >90 (mL/min)   GFR calc Af Amer >90  >90 (mL/min)  CBC      Component Value Range   WBC 8.5  4.0 - 10.5 (K/uL)   RBC 4.19  3.87 - 5.11 (  MIL/uL)   Hemoglobin 13.2  12.0 - 15.0 (g/dL)   HCT 96.0  45.4 - 09.8 (%)   MCV 91.4  78.0 - 100.0 (fL)   MCH 31.5  26.0 - 34.0 (pg)   MCHC 34.5  30.0 - 36.0 (g/dL)   RDW 11.9  14.7 - 82.9 (%)   Platelets 310  150 - 400 (K/uL)  GLUCOSE, CAPILLARY      Component Value Range    Glucose-Capillary 185 (*) 70 - 99 (mg/dL)   Comment 1 Notify RN     Comment 2 Documented in Chart    GLUCOSE, CAPILLARY      Component Value Range   Glucose-Capillary 192 (*) 70 - 99 (mg/dL)  BASIC METABOLIC PANEL      Component Value Range   Sodium 135  135 - 145 (mEq/L)   Potassium 4.2  3.5 - 5.1 (mEq/L)   Chloride 98  96 - 112 (mEq/L)   CO2 27  19 - 32 (mEq/L)   Glucose, Bld 163 (*) 70 - 99 (mg/dL)   BUN 15  6 - 23 (mg/dL)   Creatinine, Ser 5.62  0.50 - 1.10 (mg/dL)   Calcium 9.6  8.4 - 13.0 (mg/dL)   GFR calc non Af Amer >90  >90 (mL/min)   GFR calc Af Amer >90  >90 (mL/min)  GLUCOSE, CAPILLARY      Component Value Range   Glucose-Capillary 119 (*) 70 - 99 (mg/dL)   Comment 1 Notify RN     Comment 2 Documented in Chart    GLUCOSE, CAPILLARY      Component Value Range   Glucose-Capillary 161 (*) 70 - 99 (mg/dL)   Comment 1 Notify RN     Comment 2 Documented in Chart    GLUCOSE, CAPILLARY      Component Value Range   Glucose-Capillary 190 (*) 70 - 99 (mg/dL)   Comment 1 Notify RN    GLUCOSE, CAPILLARY      Component Value Range   Glucose-Capillary 135 (*) 70 - 99 (mg/dL)  GLUCOSE, CAPILLARY      Component Value Range   Glucose-Capillary 115 (*) 70 - 99 (mg/dL)   Comment 1 Notify RN    GLUCOSE, CAPILLARY      Component Value Range   Glucose-Capillary 153 (*) 70 - 99 (mg/dL)  GLUCOSE, CAPILLARY      Component Value Range   Glucose-Capillary 131 (*) 70 - 99 (mg/dL)  GLUCOSE, CAPILLARY      Component Value Range   Glucose-Capillary 115 (*) 70 - 99 (mg/dL)  BASIC METABOLIC PANEL      Component Value Range   Sodium 134 (*) 135 - 145 (mEq/L)   Potassium 4.5  3.5 - 5.1 (mEq/L)   Chloride 98  96 - 112 (mEq/L)   CO2 26  19 - 32 (mEq/L)   Glucose, Bld 144 (*) 70 - 99 (mg/dL)   BUN 13  6 - 23 (mg/dL)   Creatinine, Ser 8.65 (*) 0.50 - 1.10 (mg/dL)   Calcium 9.5  8.4 - 78.4 (mg/dL)   GFR calc non Af Amer >90  >90 (mL/min)   GFR calc Af Amer >90  >90 (mL/min)  CBC       Component Value Range   WBC 8.3  4.0 - 10.5 (K/uL)   RBC 4.33  3.87 - 5.11 (MIL/uL)   Hemoglobin 13.7  12.0 - 15.0 (g/dL)   HCT 69.6  29.5 - 28.4 (%)   MCV 92.8  78.0 -  100.0 (fL)   MCH 31.6  26.0 - 34.0 (pg)   MCHC 34.1  30.0 - 36.0 (g/dL)   RDW 16.1  09.6 - 04.5 (%)   Platelets 371  150 - 400 (K/uL)  GLUCOSE, CAPILLARY      Component Value Range   Glucose-Capillary 274 (*) 70 - 99 (mg/dL)  GLUCOSE, CAPILLARY      Component Value Range   Glucose-Capillary 129 (*) 70 - 99 (mg/dL)   Comment 1 Documented in Chart     Comment 2 Notify RN    SODIUM, URINE, RANDOM      Component Value Range   Sodium, Ur 117    BASIC METABOLIC PANEL      Component Value Range   Sodium 134 (*) 135 - 145 (mEq/L)   Potassium 4.2  3.5 - 5.1 (mEq/L)   Chloride 96  96 - 112 (mEq/L)   CO2 27  19 - 32 (mEq/L)   Glucose, Bld 137 (*) 70 - 99 (mg/dL)   BUN 15  6 - 23 (mg/dL)   Creatinine, Ser 4.09 (*) 0.50 - 1.10 (mg/dL)   Calcium 9.3  8.4 - 81.1 (mg/dL)   GFR calc non Af Amer >90  >90 (mL/min)   GFR calc Af Amer >90  >90 (mL/min)  GLUCOSE, CAPILLARY      Component Value Range   Glucose-Capillary 130 (*) 70 - 99 (mg/dL)  GLUCOSE, CAPILLARY      Component Value Range   Glucose-Capillary 219 (*) 70 - 99 (mg/dL)  GLUCOSE, CAPILLARY      Component Value Range   Glucose-Capillary 105 (*) 70 - 99 (mg/dL)  GLUCOSE, CAPILLARY      Component Value Range   Glucose-Capillary 144 (*) 70 - 99 (mg/dL)  BASIC METABOLIC PANEL      Component Value Range   Sodium 135  135 - 145 (mEq/L)   Potassium 3.8  3.5 - 5.1 (mEq/L)   Chloride 98  96 - 112 (mEq/L)   CO2 27  19 - 32 (mEq/L)   Glucose, Bld 178 (*) 70 - 99 (mg/dL)   BUN 12  6 - 23 (mg/dL)   Creatinine, Ser 9.14 (*) 0.50 - 1.10 (mg/dL)   Calcium 9.0  8.4 - 78.2 (mg/dL)   GFR calc non Af Amer >90  >90 (mL/min)   GFR calc Af Amer >90  >90 (mL/min)  BASIC METABOLIC PANEL      Component Value Range   Sodium 134 (*) 135 - 145 (mEq/L)   Potassium 4.0  3.5 - 5.1  (mEq/L)   Chloride 96  96 - 112 (mEq/L)   CO2 27  19 - 32 (mEq/L)   Glucose, Bld 161 (*) 70 - 99 (mg/dL)   BUN 14  6 - 23 (mg/dL)   Creatinine, Ser 9.56 (*) 0.50 - 1.10 (mg/dL)   Calcium 9.6  8.4 - 21.3 (mg/dL)   GFR calc non Af Amer >90  >90 (mL/min)   GFR calc Af Amer >90  >90 (mL/min)  GLUCOSE, CAPILLARY      Component Value Range   Glucose-Capillary 187 (*) 70 - 99 (mg/dL)  COMPREHENSIVE METABOLIC PANEL      Component Value Range   Sodium 134 (*) 135 - 145 (mEq/L)   Potassium 4.1  3.5 - 5.1 (mEq/L)   Chloride 98  96 - 112 (mEq/L)   CO2 29  19 - 32 (mEq/L)   Glucose, Bld 168 (*) 70 - 99 (mg/dL)   BUN 11  6 -  23 (mg/dL)   Creatinine, Ser 1.61 (*) 0.50 - 1.10 (mg/dL)   Calcium 9.0  8.4 - 09.6 (mg/dL)   Total Protein 6.1  6.0 - 8.3 (g/dL)   Albumin 3.4 (*) 3.5 - 5.2 (g/dL)   AST 11  0 - 37 (U/L)   ALT 8  0 - 35 (U/L)   Alkaline Phosphatase 37 (*) 39 - 117 (U/L)   Total Bilirubin 0.2 (*) 0.3 - 1.2 (mg/dL)   GFR calc non Af Amer >90  >90 (mL/min)   GFR calc Af Amer >90  >90 (mL/min)  CBC      Component Value Range   WBC 7.3  4.0 - 10.5 (K/uL)   RBC 3.86 (*) 3.87 - 5.11 (MIL/uL)   Hemoglobin 12.2  12.0 - 15.0 (g/dL)   HCT 04.5  40.9 - 81.1 (%)   MCV 93.3  78.0 - 100.0 (fL)   MCH 31.6  26.0 - 34.0 (pg)   MCHC 33.9  30.0 - 36.0 (g/dL)   RDW 91.4  78.2 - 95.6 (%)   Platelets 324  150 - 400 (K/uL)  DIFFERENTIAL      Component Value Range   Neutrophils Relative 91 (*) 43 - 77 (%)   Neutro Abs 6.7  1.7 - 7.7 (K/uL)   Lymphocytes Relative 6 (*) 12 - 46 (%)   Lymphs Abs 0.4 (*) 0.7 - 4.0 (K/uL)   Monocytes Relative 3  3 - 12 (%)   Monocytes Absolute 0.2  0.1 - 1.0 (K/uL)   Eosinophils Relative 0  0 - 5 (%)   Eosinophils Absolute 0.0  0.0 - 0.7 (K/uL)   Basophils Relative 0  0 - 1 (%)   Basophils Absolute 0.0  0.0 - 0.1 (K/uL)  BASIC METABOLIC PANEL      Component Value Range   Sodium 137  135 - 145 (mEq/L)   Potassium 3.8  3.5 - 5.1 (mEq/L)   Chloride 101  96 - 112  (mEq/L)   CO2 28  19 - 32 (mEq/L)   Glucose, Bld 131 (*) 70 - 99 (mg/dL)   BUN 9  6 - 23 (mg/dL)   Creatinine, Ser 2.13 (*) 0.50 - 1.10 (mg/dL)   Calcium 9.1  8.4 - 08.6 (mg/dL)   GFR calc non Af Amer >90  >90 (mL/min)   GFR calc Af Amer >90  >90 (mL/min)  BASIC METABOLIC PANEL      Component Value Range   Sodium 136  135 - 145 (mEq/L)   Potassium 3.1 (*) 3.5 - 5.1 (mEq/L)   Chloride 99  96 - 112 (mEq/L)   CO2 28  19 - 32 (mEq/L)   Glucose, Bld 152 (*) 70 - 99 (mg/dL)   BUN 9  6 - 23 (mg/dL)   Creatinine, Ser 5.78 (*) 0.50 - 1.10 (mg/dL)   Calcium 9.0  8.4 - 46.9 (mg/dL)   GFR calc non Af Amer >90  >90 (mL/min)   GFR calc Af Amer >90  >90 (mL/min)  PRO B NATRIURETIC PEPTIDE      Component Value Range   Pro B Natriuretic peptide (BNP) 306.5 (*) 0 - 125 (pg/mL)  SODIUM, URINE, RANDOM      Component Value Range   Sodium, Ur 111    OSMOLALITY, URINE      Component Value Range   Osmolality, Ur 280 (*) 390 - 1090 (mOsm/kg)  URIC ACID      Component Value Range   Uric Acid, Serum 1.0 (*) 2.4 -  7.0 (mg/dL)  URINALYSIS, ROUTINE W REFLEX MICROSCOPIC      Component Value Range   Color, Urine STRAW (*) YELLOW    APPearance CLEAR  CLEAR    Specific Gravity, Urine 1.008  1.005 - 1.030    pH 7.5  5.0 - 8.0    Glucose, UA NEGATIVE  NEGATIVE (mg/dL)   Hgb urine dipstick NEGATIVE  NEGATIVE    Bilirubin Urine NEGATIVE  NEGATIVE    Ketones, ur NEGATIVE  NEGATIVE (mg/dL)   Protein, ur NEGATIVE  NEGATIVE (mg/dL)   Urobilinogen, UA 0.2  0.0 - 1.0 (mg/dL)   Nitrite NEGATIVE  NEGATIVE    Leukocytes, UA NEGATIVE  NEGATIVE   GLUCOSE, CAPILLARY      Component Value Range   Glucose-Capillary 170 (*) 70 - 99 (mg/dL)  GLUCOSE, CAPILLARY      Component Value Range   Glucose-Capillary 179 (*) 70 - 99 (mg/dL)  GLUCOSE, CAPILLARY      Component Value Range   Glucose-Capillary 115 (*) 70 - 99 (mg/dL)  GLUCOSE, CAPILLARY      Component Value Range   Glucose-Capillary 129 (*) 70 - 99 (mg/dL)    GLUCOSE, CAPILLARY      Component Value Range   Glucose-Capillary 176 (*) 70 - 99 (mg/dL)  GLUCOSE, CAPILLARY      Component Value Range   Glucose-Capillary 130 (*) 70 - 99 (mg/dL)  COMPREHENSIVE METABOLIC PANEL      Component Value Range   Sodium 136  135 - 145 (mEq/L)   Potassium 3.5  3.5 - 5.1 (mEq/L)   Chloride 100  96 - 112 (mEq/L)   CO2 29  19 - 32 (mEq/L)   Glucose, Bld 152 (*) 70 - 99 (mg/dL)   BUN 11  6 - 23 (mg/dL)   Creatinine, Ser 1.61 (*) 0.50 - 1.10 (mg/dL)   Calcium 8.8  8.4 - 09.6 (mg/dL)   Total Protein 5.5 (*) 6.0 - 8.3 (g/dL)   Albumin 3.4 (*) 3.5 - 5.2 (g/dL)   AST 10  0 - 37 (U/L)   ALT 8  0 - 35 (U/L)   Alkaline Phosphatase 33 (*) 39 - 117 (U/L)   Total Bilirubin 0.3  0.3 - 1.2 (mg/dL)   GFR calc non Af Amer >90  >90 (mL/min)   GFR calc Af Amer >90  >90 (mL/min)  CBC      Component Value Range   WBC 9.7  4.0 - 10.5 (K/uL)   RBC 3.77 (*) 3.87 - 5.11 (MIL/uL)   Hemoglobin 11.9 (*) 12.0 - 15.0 (g/dL)   HCT 04.5 (*) 40.9 - 46.0 (%)   MCV 91.8  78.0 - 100.0 (fL)   MCH 31.6  26.0 - 34.0 (pg)   MCHC 34.4  30.0 - 36.0 (g/dL)   RDW 81.1  91.4 - 78.2 (%)   Platelets 319  150 - 400 (K/uL)  DIFFERENTIAL      Component Value Range   Neutrophils Relative 89 (*) 43 - 77 (%)   Neutro Abs 8.6 (*) 1.7 - 7.7 (K/uL)   Lymphocytes Relative 5 (*) 12 - 46 (%)   Lymphs Abs 0.5 (*) 0.7 - 4.0 (K/uL)   Monocytes Relative 6  3 - 12 (%)   Monocytes Absolute 0.6  0.1 - 1.0 (K/uL)   Eosinophils Relative 0  0 - 5 (%)   Eosinophils Absolute 0.0  0.0 - 0.7 (K/uL)   Basophils Relative 0  0 - 1 (%)   Basophils Absolute 0.0  0.0 - 0.1 (K/uL)  BASIC METABOLIC PANEL      Component Value Range   Sodium 137  135 - 145 (mEq/L)   Potassium 3.4 (*) 3.5 - 5.1 (mEq/L)   Chloride 100  96 - 112 (mEq/L)   CO2 26  19 - 32 (mEq/L)   Glucose, Bld 145 (*) 70 - 99 (mg/dL)   BUN 12  6 - 23 (mg/dL)   Creatinine, Ser 1.61 (*) 0.50 - 1.10 (mg/dL)   Calcium 9.0  8.4 - 09.6 (mg/dL)   GFR calc  non Af Amer >90  >90 (mL/min)   GFR calc Af Amer >90  >90 (mL/min)  GLUCOSE, CAPILLARY      Component Value Range   Glucose-Capillary 139 (*) 70 - 99 (mg/dL)  BASIC METABOLIC PANEL      Component Value Range   Sodium 139  135 - 145 (mEq/L)   Potassium 3.3 (*) 3.5 - 5.1 (mEq/L)   Chloride 101  96 - 112 (mEq/L)   CO2 26  19 - 32 (mEq/L)   Glucose, Bld 143 (*) 70 - 99 (mg/dL)   BUN 13  6 - 23 (mg/dL)   Creatinine, Ser 0.45 (*) 0.50 - 1.10 (mg/dL)   Calcium 9.1  8.4 - 40.9 (mg/dL)   GFR calc non Af Amer >90  >90 (mL/min)   GFR calc Af Amer >90  >90 (mL/min)  BASIC METABOLIC PANEL      Component Value Range   Sodium 136  135 - 145 (mEq/L)   Potassium 3.9  3.5 - 5.1 (mEq/L)   Chloride 100  96 - 112 (mEq/L)   CO2 28  19 - 32 (mEq/L)   Glucose, Bld 150 (*) 70 - 99 (mg/dL)   BUN 11  6 - 23 (mg/dL)   Creatinine, Ser 8.11 (*) 0.50 - 1.10 (mg/dL)   Calcium 9.0  8.4 - 91.4 (mg/dL)   GFR calc non Af Amer >90  >90 (mL/min)   GFR calc Af Amer >90  >90 (mL/min)  GLUCOSE, CAPILLARY      Component Value Range   Glucose-Capillary 194 (*) 70 - 99 (mg/dL)  GLUCOSE, CAPILLARY      Component Value Range   Glucose-Capillary 108 (*) 70 - 99 (mg/dL)  GLUCOSE, CAPILLARY      Component Value Range   Glucose-Capillary 141 (*) 70 - 99 (mg/dL)  GLUCOSE, CAPILLARY      Component Value Range   Glucose-Capillary 156 (*) 70 - 99 (mg/dL)  GLUCOSE, CAPILLARY      Component Value Range   Glucose-Capillary 145 (*) 70 - 99 (mg/dL)  BASIC METABOLIC PANEL      Component Value Range   Sodium 140  135 - 145 (mEq/L)   Potassium 3.7  3.5 - 5.1 (mEq/L)   Chloride 104  96 - 112 (mEq/L)   CO2 26  19 - 32 (mEq/L)   Glucose, Bld 196 (*) 70 - 99 (mg/dL)   BUN 11  6 - 23 (mg/dL)   Creatinine, Ser 7.82 (*) 0.50 - 1.10 (mg/dL)   Calcium 9.1  8.4 - 95.6 (mg/dL)   GFR calc non Af Amer >90  >90 (mL/min)   GFR calc Af Amer >90  >90 (mL/min)  GLUCOSE, CAPILLARY      Component Value Range   Glucose-Capillary 162 (*) 70  - 99 (mg/dL)  BASIC METABOLIC PANEL      Component Value Range   Sodium 138  135 - 145 (mEq/L)   Potassium 4.0  3.5 - 5.1 (mEq/L)   Chloride 102  96 - 112 (mEq/L)   CO2 24  19 - 32 (mEq/L)   Glucose, Bld 220 (*) 70 - 99 (mg/dL)   BUN 12  6 - 23 (mg/dL)   Creatinine, Ser 4.09 (*) 0.50 - 1.10 (mg/dL)   Calcium 9.0  8.4 - 81.1 (mg/dL)   GFR calc non Af Amer >90  >90 (mL/min)   GFR calc Af Amer >90  >90 (mL/min)  GLUCOSE, CAPILLARY      Component Value Range   Glucose-Capillary 137 (*) 70 - 99 (mg/dL)  BASIC METABOLIC PANEL      Component Value Range   Sodium 140  135 - 145 (mEq/L)   Potassium 3.6  3.5 - 5.1 (mEq/L)   Chloride 102  96 - 112 (mEq/L)   CO2 27  19 - 32 (mEq/L)   Glucose, Bld 182 (*) 70 - 99 (mg/dL)   BUN 9  6 - 23 (mg/dL)   Creatinine, Ser 9.14 (*) 0.50 - 1.10 (mg/dL)   Calcium 9.1  8.4 - 78.2 (mg/dL)   GFR calc non Af Amer >90  >90 (mL/min)   GFR calc Af Amer >90  >90 (mL/min)  GLUCOSE, CAPILLARY      Component Value Range   Glucose-Capillary 221 (*) 70 - 99 (mg/dL)  GLUCOSE, CAPILLARY      Component Value Range   Glucose-Capillary 126 (*) 70 - 99 (mg/dL)  GLUCOSE, CAPILLARY      Component Value Range   Glucose-Capillary 122 (*) 70 - 99 (mg/dL)  BASIC METABOLIC PANEL      Component Value Range   Sodium 139  135 - 145 (mEq/L)   Potassium 3.7  3.5 - 5.1 (mEq/L)   Chloride 101  96 - 112 (mEq/L)   CO2 27  19 - 32 (mEq/L)   Glucose, Bld 178 (*) 70 - 99 (mg/dL)   BUN 12  6 - 23 (mg/dL)   Creatinine, Ser 9.56 (*) 0.50 - 1.10 (mg/dL)   Calcium 8.9  8.4 - 21.3 (mg/dL)   GFR calc non Af Amer >90  >90 (mL/min)   GFR calc Af Amer >90  >90 (mL/min)  GLUCOSE, CAPILLARY      Component Value Range   Glucose-Capillary 99  70 - 99 (mg/dL)  CBC      Component Value Range   WBC 10.5  4.0 - 10.5 (K/uL)   RBC 3.81 (*) 3.87 - 5.11 (MIL/uL)   Hemoglobin 12.0  12.0 - 15.0 (g/dL)   HCT 08.6 (*) 57.8 - 46.0 (%)   MCV 93.7  78.0 - 100.0 (fL)   MCH 31.5  26.0 - 34.0 (pg)    MCHC 33.6  30.0 - 36.0 (g/dL)   RDW 46.9  62.9 - 52.8 (%)   Platelets 303  150 - 400 (K/uL)  GLUCOSE, CAPILLARY      Component Value Range   Glucose-Capillary 117 (*) 70 - 99 (mg/dL)   Comment 1 Documented in Chart     Comment 2 Notify RN    VALPROIC ACID LEVEL      Component Value Range   Valproic Acid Lvl 51.7  50.0 - 100.0 (ug/mL)  SODIUM, URINE, RANDOM      Component Value Range   Sodium, Ur 95    GLUCOSE, CAPILLARY      Component Value Range   Glucose-Capillary 147 (*) 70 - 99 (mg/dL)   Comment 1 Notify RN     Comment 2 Documented in  Chart    GLUCOSE, CAPILLARY      Component Value Range   Glucose-Capillary 134 (*) 70 - 99 (mg/dL)  BASIC METABOLIC PANEL      Component Value Range   Sodium 140  135 - 145 (mEq/L)   Potassium 3.9  3.5 - 5.1 (mEq/L)   Chloride 101  96 - 112 (mEq/L)   CO2 33 (*) 19 - 32 (mEq/L)   Glucose, Bld 129 (*) 70 - 99 (mg/dL)   BUN 9  6 - 23 (mg/dL)   Creatinine, Ser 1.61 (*) 0.50 - 1.10 (mg/dL)   Calcium 9.1  8.4 - 09.6 (mg/dL)   GFR calc non Af Amer >90  >90 (mL/min)   GFR calc Af Amer >90  >90 (mL/min)  GLUCOSE, CAPILLARY      Component Value Range   Glucose-Capillary 111 (*) 70 - 99 (mg/dL)   Comment 1 Notify RN     Comment 2 Documented in Chart    GLUCOSE, CAPILLARY      Component Value Range   Glucose-Capillary 139 (*) 70 - 99 (mg/dL)  GLUCOSE, CAPILLARY      Component Value Range   Glucose-Capillary 125 (*) 70 - 99 (mg/dL)  BASIC METABOLIC PANEL      Component Value Range   Sodium 141  135 - 145 (mEq/L)   Potassium 4.4  3.5 - 5.1 (mEq/L)   Chloride 101  96 - 112 (mEq/L)   CO2 33 (*) 19 - 32 (mEq/L)   Glucose, Bld 125 (*) 70 - 99 (mg/dL)   BUN 10  6 - 23 (mg/dL)   Creatinine, Ser 0.45 (*) 0.50 - 1.10 (mg/dL)   Calcium 9.0  8.4 - 40.9 (mg/dL)   GFR calc non Af Amer >90  >90 (mL/min)   GFR calc Af Amer >90  >90 (mL/min)  BASIC METABOLIC PANEL      Component Value Range   Sodium 138  135 - 145 (mEq/L)   Potassium 4.1  3.5 - 5.1  (mEq/L)   Chloride 97  96 - 112 (mEq/L)   CO2 32  19 - 32 (mEq/L)   Glucose, Bld 189 (*) 70 - 99 (mg/dL)   BUN 12  6 - 23 (mg/dL)   Creatinine, Ser 8.11  0.50 - 1.10 (mg/dL)   Calcium 9.3  8.4 - 91.4 (mg/dL)   GFR calc non Af Amer >90  >90 (mL/min)   GFR calc Af Amer >90  >90 (mL/min)  PHOSPHORUS      Component Value Range   Phosphorus 4.6  2.3 - 4.6 (mg/dL)  MAGNESIUM      Component Value Range   Magnesium 2.1  1.5 - 2.5 (mg/dL)  BASIC METABOLIC PANEL      Component Value Range   Sodium 136  135 - 145 (mEq/L)   Potassium 4.2  3.5 - 5.1 (mEq/L)   Chloride 95 (*) 96 - 112 (mEq/L)   CO2 35 (*) 19 - 32 (mEq/L)   Glucose, Bld 121 (*) 70 - 99 (mg/dL)   BUN 11  6 - 23 (mg/dL)   Creatinine, Ser 7.82 (*) 0.50 - 1.10 (mg/dL)   Calcium 9.2  8.4 - 95.6 (mg/dL)   GFR calc non Af Amer >90  >90 (mL/min)   GFR calc Af Amer >90  >90 (mL/min)    Ct Head Wo Contrast  01/05/2012  *RADIOLOGY REPORT*  Clinical Data: Ventricular drain removal.  Subarachnoid hemorrhage  CT HEAD WITHOUT CONTRAST  Technique:  Contiguous axial images were  obtained from the base of the skull through the vertex without contrast.  Comparison: 12/31/2011  Findings: Right frontal ventricular drain has been removed. Interval development of a 4 mm right frontal extra-axial fluid collection.  This is relatively low density but slightly higher density than CSF.  There is a small amount of pneumocephalus frontally on the right.  Slight increase in midline shift compared with the prior study which now measures 4.4 mm.  Ventricles are not enlarged.  Subarachnoid hemorrhage has nearly completely resolved.  No new hemorrhage.  No acute infarct or mass.  Air-fluid level left maxillary sinus is unchanged.  IMPRESSION: Interval removal of right frontal ventricular drain with development of a low density subdural fluid collection on the right.  There is mild subdural gas present.  There is increase in midline shift, now 4.4 mm.  Negative for  hydrocephalus.  No new hemorrhage.  Original Report Authenticated By: Camelia Phenes, M.D.   Ct Head Wo Contrast  12/31/2011  *RADIOLOGY REPORT*  Clinical Data:  Recent diagnosis of subarachnoid hemorrhage complicated by vasospasm.  Worsening of her headache today.  CT HEAD WITHOUT CONTRAST CT MAXILLOFACIAL WITHOUT CONTRAST  Technique:  Multidetector CT imaging of the head and maxillofacial structures were performed using the standard protocol without intravenous contrast. Multiplanar CT image reconstructions of the maxillofacial structures were also generated.  Comparison:  Multiple recent CTs of the head, most recently 12/24/2011.  CT HEAD  Findings: A right frontal ventriculostomy catheter is stable in position.  The ventricular size has normalized.  Previously seen subarachnoid hemorrhage is near completely resolved.  There is still some subarachnoid blood within the parietal sulci bilaterally.  Minimal intraventricular blood is noted within the occipital horns.  There is no new hemorrhage.  There is no hydrocephalus.  Small fluid levels are present in the maxillary and sphenoid sinuses bilaterally, improved from the prior study.  The patient is status post creation of bilateral nasal antral windows and partial ethmoidectomies.  Scattered ethmoid sinus mucosal thickening is noted.  Residual nasal polyps are nonobstructive. The mastoid air cells are clear.  The osseous skull is intact apart from the right frontal burr hole.  IMPRESSION:  1.  Normalization of ventricular size. 2.  Minimal residual subarachnoid and intraventricular blood products continue improve. 3.  No new hemorrhage or acute abnormality. 4.  Sinus disease has improved since the prior study as well.  CT MAXILLOFACIAL  Findings:   The patient is status post nasal surgery for bilateral nasal antral windows.  The ostiomeatal complex is widely patent bilaterally.  Small nasal polyps along the upper nasal septum are nonobstructive.  The patient is  status post partial ethmoidectomies bilaterally.  There is scattered mucosal thickening within the residual ethmoid air cells.  Frontal sinuses are clear.  Small fluid levels in the sphenoid sinuses bilaterally have improved.  IMPRESSION:  1.  Decreasing fluid levels in the maxillary and sphenoid sinuses bilaterally. 2.  Mild mucosal thickening is scattered throughout the ethmoid air cells. 3.  Postsurgical changes of the sinuses as described.  Original Report Authenticated By: Jamesetta Orleans. MATTERN, M.D.   Ct Head Wo Contrast  12/24/2011  *RADIOLOGY REPORT*  Clinical Data: Worsening headache.  CT HEAD WITHOUT CONTRAST  Technique:  Contiguous axial images were obtained from the base of the skull through the vertex without contrast.  Comparison: CT head without contrast 12/21/2011.  Findings: The right frontal ventriculostomy catheters stable in position.  The ventricular size is reduced.  The interventricular hemorrhage is  again noted bilaterally.  Subarachnoid or subdural blood over the tentorium is slightly less conspicuous than on the prior exams. Posterior bilateral subarachnoid hemorrhage is similar to the prior exams.  No new hemorrhage is present.  No acute infarct or mass lesion is present.  The fluid levels are again noted within the in the maxillary and sphenoid sinuses bilaterally, slightly less prominent than on the prior study.  There is partial opacification of the ethmoid air cells.  The patient is status post nasal surgery.  Mild mucosal thickening is noted along the inferior frontal sinuses, improved. The mastoid air cells are clear.  Apart from the burr hole, the osseous skull is intact.  IMPRESSION:  1.  Continued reduction in size of the ventricles.  The ventriculostomy catheter is stable. 2.  Persistent subarachnoid and intraventricular hemorrhage. 3.  No new hemorrhage. 4.  Improving sinus disease.  Original Report Authenticated By: Jamesetta Orleans. MATTERN, M.D.   Ct Head Wo  Contrast  12/21/2011  *RADIOLOGY REPORT*  Clinical Data: Follow-up hydrocephalus.  Recent subarachnoid hemorrhage.  CT HEAD WITHOUT CONTRAST  Technique:  Contiguous axial images were obtained from the base of the skull through the vertex without contrast.  Comparison: 12/20/2011 most recent  Findings: Interval decrease in ventricular size following adjustment of the ventricular drainage to lower the pressure. Biventricular diameter on image 14 is 37 mm, as compared with 42 mm on 5/7.  Temporal horns are also less distended.  Persistent intraventricular blood.  Continued improvement subarachnoid blood.  No extra-axial fluid.  Ventricular catheter good position.  No areas suspicious for acute infarction or parenchymal hemorrhage. No mass lesion observed.  Moderate sinus disease with air-fluid levels in the sphenoid and maxillary region and near complete filling of the frontal and ethmoid sinuses.  IMPRESSION: Improved hydrocephalus as described.  Increasing bilateral sinus opacities.  Original Report Authenticated By: Elsie Stain, M.D.   Ct Head Wo Contrast  12/20/2011  *RADIOLOGY REPORT*  Clinical Data: Worsening neurologic status.  Recent subarachnoid hemorrhage.  CT HEAD WITHOUT CONTRAST  Technique:  Contiguous axial images were obtained from the base of the skull through the vertex without contrast.  Comparison: Multiple prior CT scans, most recent 12/18/2011.  Findings: Despite the satisfactory location of the right frontal ventricular drainage catheter, there is increasing hydrocephalus when compared with the examination 2 days ago.  Bifrontal diameter at the level of the foramen of Monro is 41 mm as compared with 32 mm on 12/18/2011.  Temporal horns are dilated.  There is improving subarachnoid blood, but persistent intraventricular blood.  No new subarachnoid blood is seen. There is no visible infarction or parenchymal hemorrhage.  No midline shift. No extra-axial fluid.  IMPRESSION: Worsening  hydrocephalus despite good position of intraventricular drainage catheter.  Dr. Newell Coral aware.  No new subarachnoid blood.  Original Report Authenticated By: Elsie Stain, M.D.   Ct Head Wo Contrast  12/18/2011  *RADIOLOGY REPORT*  Clinical Data: Follow up subarachnoid hemorrhage.  Worsening headache.  CT HEAD WITHOUT CONTRAST 12/18/2011:  Technique:  Contiguous axial images were obtained from the base of the skull through the vertex without contrast.  Comparison: Unenhanced cranial CT 12/16/2011, 09/19/2010.  Findings: Interval right frontal ventriculostomy catheter whose tip is in the frontal horn of the right lateral ventricle.  Interval decrease in size of the ventricles since the examination 2 days ago.  Persistent hemorrhage in the lateral, third, and fourth ventricles.  Diffuse subarachnoid hemorrhage which is similar in distribution but has decreased in  amount since the prior examination.  No new areas of hemorrhage.  No midline shift.  Near complete opacification of the ethmoid air cells bilaterally, with air-fluid levels and posterior ethmoid air cells bilaterally.  Mucosal thickening and air fluid level in the left sphenoid sinus. Opacification of the frontal sinuses bilaterally, left greater than right.  Mucosal thickening involving the maxillary sinuses, with prior left medial antrostomy.  Complete opacification of the left nasal cavity.  IMPRESSION:  1.  Interval decrease in the amount of diffuse subarachnoid hemorrhage since the examination 2 days ago, though a moderate amount of subarachnoid blood persists. 2.  Interval decrease in ventricular size post right frontal ventriculostomy placement. 3.  Persistent hemorrhage in the lateral, third, and fourth ventricles. 4.  No midline shift.  No new intracranial abnormalities. 5.  Acute superimposed upon chronic pansinusitis.  Complete opacification of the left nasal cavity.  Original Report Authenticated By: Arnell Sieving, M.D.   Ct Head Wo  Contrast  12/16/2011  *RADIOLOGY REPORT*  Clinical Data: Subarachnoid hemorrhage.  Increasing lethargy.  CT HEAD WITHOUT CONTRAST  Technique:  Contiguous axial images were obtained from the base of the skull through the vertex without contrast.  Comparison: 12/16/2011  Findings: Diffuse subarachnoid hemorrhage again noted, similar to prior study.  Increasing intraventricular blood, now layering in the posterior horns of the lateral ventricles.  Ventricular size has increased since prior study.  No midline shift.  No parenchymal hemorrhage.  Chronic sinusitis changes throughout the paranasal sinuses.  No acute calvarial abnormality.  IMPRESSION: Continued large diffuse subarachnoid hemorrhage.  Increasing intraventricular blood and ventricular size.  Original Report Authenticated By: Cyndie Chime, M.D.   Dg Chest Port 1 View  01/01/2012  *RADIOLOGY REPORT*  Clinical Data: Follow up atelectasis  PORTABLE CHEST - 1 VIEW  Comparison: 12/29/2011  Findings: Previously noted bibasilar atelectasis has cleared. Lungs are now clear.  No heart failure or pneumonia.  No pleural effusion.  Central venous catheter tip at the caval atrial junction is unchanged.  IMPRESSION: Interval clearing of bibasilar airspace disease.  The lungs are now clear.  Original Report Authenticated By: Camelia Phenes, M.D.   Dg Chest Port 1 View  12/29/2011  *RADIOLOGY REPORT*  Clinical Data: This is atelectasis  PORTABLE CHEST - 1 VIEW  Comparison: Chest radiograph 12/28/2011  Findings: Right central venous line is unchanged.  Stable cardiac silhouette.  There are bibasilar air space opacities not changed from prior. Left lower lobe atelectasis and effusion unchanged. Upper lungs are clear.  A focus of linear atelectasis in the right upper lobe. No pneumothorax. Remote left rib fractures.  IMPRESSION:  1.  No significant change. 2. Bibasilar air space disease, left lower lobe atelectasis, and left effusion.  Original Report Authenticated By:  Genevive Bi, M.D.   Dg Chest Port 1 View  12/28/2011  *RADIOLOGY REPORT*  Clinical Data: Edema.  PORTABLE CHEST - 1 VIEW  Comparison: 12/27/2011  Findings: Bilateral lower lobe airspace opacities, possibly edema. Heart is borderline in size.  Mild vascular congestion.  Right central line is unchanged.  Layering left pleural effusion.  IMPRESSION: Worsening bilateral lower lobe airspace opacities, question edema.  New left pleural effusion.  Original Report Authenticated By: Cyndie Chime, M.D.   Dg Chest Portable 1 View  12/27/2011  *RADIOLOGY REPORT*  Clinical Data: Central line placement  PORTABLE CHEST - 1 VIEW  Comparison: 12/27/2011  Findings: Cardiomediastinal silhouette is stable.  No acute infiltrate or pleural effusion.  No pulmonary edema.  Stable old right rib fractures.  Stable atelectasis right midlung.  There is a right IJ central line with tip in SVC right atrium junction.  No diagnostic pneumothorax.  IMPRESSION: Right IJ central line with tip in SVC right atrium junction.  No diagnostic pneumothorax.  Original Report Authenticated By: Natasha Mead, M.D.   Dg Chest Port 1 View  12/27/2011  *RADIOLOGY REPORT*  Clinical Data: Follow up left base opacity  PORTABLE CHEST - 1 VIEW  Comparison: 12/16/2011  Findings:  The heart size appears normal.  Asymmetric opacification within the left midlung and left base is identified.  This is not significantly changed from the previous exam.  Plate-like atelectasis is noted within the perihilar right midlung.  IMPRESSION:  1.  Left perihilar opacity which is unchanged from previous study. 2.  New plate-like atelectasis within the right midlung.  Original Report Authenticated By: Rosealee Albee, M.D.   Portable Chest Xray  12/16/2011  *RADIOLOGY REPORT*  Clinical Data: Subarachnoid hemorrhage, preoperative radiograph  PORTABLE CHEST - 1 VIEW  Comparison: 09/25/2006  Findings: Prominent pulmonary vessels.  Heart size upper normal. Mild lingular  opacity.  No pneumothorax.  No acute osseous abnormality.  IMPRESSION:  Mild lingular opacity, atelectasis versus infiltrate.  Original Report Authenticated By: Waneta Martins, M.D.   Ct Maxillofacial Wo Cm  12/31/2011  *RADIOLOGY REPORT*  Clinical Data:  Recent diagnosis of subarachnoid hemorrhage complicated by vasospasm.  Worsening of her headache today.  CT HEAD WITHOUT CONTRAST CT MAXILLOFACIAL WITHOUT CONTRAST  Technique:  Multidetector CT imaging of the head and maxillofacial structures were performed using the standard protocol without intravenous contrast. Multiplanar CT image reconstructions of the maxillofacial structures were also generated.  Comparison:  Multiple recent CTs of the head, most recently 12/24/2011.  CT HEAD  Findings: A right frontal ventriculostomy catheter is stable in position.  The ventricular size has normalized.  Previously seen subarachnoid hemorrhage is near completely resolved.  There is still some subarachnoid blood within the parietal sulci bilaterally.  Minimal intraventricular blood is noted within the occipital horns.  There is no new hemorrhage.  There is no hydrocephalus.  Small fluid levels are present in the maxillary and sphenoid sinuses bilaterally, improved from the prior study.  The patient is status post creation of bilateral nasal antral windows and partial ethmoidectomies.  Scattered ethmoid sinus mucosal thickening is noted.  Residual nasal polyps are nonobstructive. The mastoid air cells are clear.  The osseous skull is intact apart from the right frontal burr hole.  IMPRESSION:  1.  Normalization of ventricular size. 2.  Minimal residual subarachnoid and intraventricular blood products continue improve. 3.  No new hemorrhage or acute abnormality. 4.  Sinus disease has improved since the prior study as well.  CT MAXILLOFACIAL  Findings:   The patient is status post nasal surgery for bilateral nasal antral windows.  The ostiomeatal complex is widely patent  bilaterally.  Small nasal polyps along the upper nasal septum are nonobstructive.  The patient is status post partial ethmoidectomies bilaterally.  There is scattered mucosal thickening within the residual ethmoid air cells.  Frontal sinuses are clear.  Small fluid levels in the sphenoid sinuses bilaterally have improved.  IMPRESSION:  1.  Decreasing fluid levels in the maxillary and sphenoid sinuses bilaterally. 2.  Mild mucosal thickening is scattered throughout the ethmoid air cells. 3.  Postsurgical changes of the sinuses as described.  Original Report Authenticated By: Jamesetta Orleans. MATTERN, M.D.   Ir Angio Intra Extracran Sel Com  Carotid Innominate Bilat Mod Sed  12/28/2011  *RADIOLOGY REPORT*  Clinical Data:  History of subarachnoid hemorrhage.  Severe headaches.  BILATERAL COMMON CAROTID AND BILATERAL VERTEBRAL ARTERY ANGIOGRAMS  Comparison: Catheter angiogram of 12/16/2011.  Following A full explanation of the procedure along with the potential associated complications, an informed witnessed consent was obtained.  The right groin was prepped and draped in the usual sterile fashion.  Thereafter using modified Seldinger technique, transfemoral access into the right common femoral artery was obtained without difficulty.  Over a 0.035-inch guidewire, a 5- [place] Pinnacle sheath was inserted.  Through this and also over a 0.035-inch guidewire, a 5-French JB1 catheter was advanced to the aortic arch region and selectively positioned in the right common carotid artery, the right vertebral artery, the left common carotid artery and the left vertebral artery.  There were no acute complications.  The patient tolerated the procedure well.  Medications utilized: Versed 2.5 mg IV.  Fentanyl 75 mcg IV.  Contrast:  Omnipaque-300 approximately 60 ml.  Findings:  The right vertebral artery origin is normal.  The vessel opacifies to the cranial skull base.  There is a diffuse tapered moderately severe narrowing of the  right vertebrobasilar junction just proximal to the right posterior inferior cerebellar artery, and to a lesser degree just distal to it.  The basilar artery, the posterior cerebral arteries, the superior cerebellar arteries and the anterior-inferior cerebellar arteries opacify normally into the capillary and venous phases.  Unopacified blood is seen in the basilar artery from a contralateral vertebral artery.  The right common carotid arteriogram demonstrates the right external carotid artery and its major branches to be normal.  The right internal carotid artery at the bulb to the cranial skull base also opacifies normally.  The petrous and the cavernous segments are normal.  There is a mild circumferential narrowing of the right ICA supraclinoid segment.  The right middle cerebral artery and the right anterior cerebral artery also demonstrate mild diffuse narrowing proximally.  Distal to this, the right MCA trifurcation branches and the right anterior cerebral artery distal to the A2 segments are seen to opacify normally into the capillary and venous phases.  The left right common carotid arteriogram demonstrates the left external carotid artery and its major branches to be normal.  The left internal carotid artery at the bulb to the cranial skull base is also normal.  The petrous and the cavernous segment are normal.  Mild circumferential narrowing of the left ICA supraclinoid segment is seen.  Also demonstrated is mild proximal tapered narrowing of the left anterior cerebral artery A1 segment.  The A2 segment opacifies normally.  The left middle cerebral artery is seen to opacify normally into the capillary and venous phases.  The left vertebral artery origin is normal.  The vessel opacifies normally to the cranial skull base.  There is again diffuse moderately severe tapered narrowing of the left vertebrobasilar junction proximal and distal to the left posterior-inferior cerebellar artery.  Mild narrowing of  the left posterior-inferior cerebellar artery proximally is also seen.  The basilar artery, the posterior cerebral arteries, the superior cerebellar arteries and the anterior-inferior cerebellar arteries opacify normally into the capillary and venous phases.  IMPRESSION: 1.  Moderately severe tapered narrowing of the vertebrobasilar junctions bilaterally, extending into the left posterior-inferior cerebral artery proximally.  This would be consistent with vasospasm given the clinical context and compared to the previous examination. 2.  Mild diffuse narrowing of the internal carotid arteries and the supraclinoid  segments, and also of the proximal right-sided middle cerebral artery and the right anterior cerebral artery, and the proximal left anterior cerebral artery. 3.  No angiographic evidence of intracranial aneurysm, arteriovenous malformation, dural AV fistula or of dissections seen.  Original Report Authenticated By: Oneal Grout, M.D.   Ir Angio Intra Extracran Sel Com Carotid Innominate Bilat Mod Sed  12/22/2011  *RADIOLOGY REPORT*  Clinical Data: Acute onset of headaches, nausea, vomiting.  CT scan of the brain revealing subarachnoid hemorrhage.  BILATERAL COMMON CAROTID AND BILATERAL VERTEBRAL ARTERY ANGIOGRAMS  Comparison: CT of the brain of 12/16/2011.  Following a full explanation of the procedure along with the potential associated complications, an informed witnessed consent was obtained.  The right groin was prepped and draped in the usual sterile fashion.  Thereafter using modified Seldinger technique, transfemoral access into the right common femoral artery was obtained without difficulty.  Over a 0.035-inch guidewire, a 5- [place] Pinnacle sheath was inserted.  Through this and also over a 0.035-inch guidewire, a 5-French JB1 catheter was advanced to the aortic arch region and selectively positioned in the right common carotid artery, the right vertebral artery, the left common carotid  artery and the left vertebral artery.  There were no acute complications.  The patient tolerated the procedure well.  Medications utilized: Versed 4 mg IV.  Dilaudid 2 mg IV.  Fentanyl 100 mcg IV.  Contrast: Omnipaque-300 approximately 60 ml.  Findings:  The right vertebral artery origin is normal.  The vessel is seen to opacify normally to the cranial skull base.  There is normal opacification of the right posterior-inferior cerebellar artery and the right vertebrobasilar junction.  The opacified portion of the basilar artery, the posterior cerebral arteries, the superior cerebellar arteries and the anterior- inferior cerebellar arteries is normal into the capillary and venous phases.  Unopacified blood is seen in the basilar artery from the contralateral vertebral artery.  The right common carotid arteriogram demonstrates the right external carotid artery and its major branches to be normal.  The right internal carotid artery at the bulb to the cranial skull base opacifies normally.  The petrous, the cavernous and the supraclinoid segments are normal.  The right middle and the right anterior cerebral arteries opacify normally into the capillary and venous phases.  The left common carotid arteriogram demonstrates the left external carotid artery and its major branches to be normal.  The left internal carotid artery at the bulb to the cranial skull base opacifies normally.  The petrous, the cavernous and the supraclinoid segments are normal.  The left middle and the left anterior cerebral arteries opacify normally into the capillary and venous phases.  The left vertebral artery origin is normal.  The vessel opacifies normally to the cranial skull base.  There is normal opacification of the left posterior-inferior cerebellar artery and the left vertebrobasilar junction.  The basilar artery, the posterior cerebral arteries, the superior cerebellar arteries and the anterior-inferior cerebellar arteries opacify normally  into the capillary and venous phases.  Unopacified blood is seen in the basilar artery from the contralateral vertebral artery.  IMPRESSION: 1.  Angiographically no evidence of an aneurysm, intracranial dissection, stenosis, arteriovenous malformation or of dural AV fistula.  2.  Venous return within normal limits.  Original Report Authenticated By: Oneal Grout, M.D.   Ir Angio Vertebral Sel Vertebral Bilat Mod Sed  12/28/2011  *RADIOLOGY REPORT*  Clinical Data:  History of subarachnoid hemorrhage.  Severe headaches.  BILATERAL COMMON CAROTID AND BILATERAL VERTEBRAL  ARTERY ANGIOGRAMS  Comparison: Catheter angiogram of 12/16/2011.  Following A full explanation of the procedure along with the potential associated complications, an informed witnessed consent was obtained.  The right groin was prepped and draped in the usual sterile fashion.  Thereafter using modified Seldinger technique, transfemoral access into the right common femoral artery was obtained without difficulty.  Over a 0.035-inch guidewire, a 5- [place] Pinnacle sheath was inserted.  Through this and also over a 0.035-inch guidewire, a 5-French JB1 catheter was advanced to the aortic arch region and selectively positioned in the right common carotid artery, the right vertebral artery, the left common carotid artery and the left vertebral artery.  There were no acute complications.  The patient tolerated the procedure well.  Medications utilized: Versed 2.5 mg IV.  Fentanyl 75 mcg IV.  Contrast:  Omnipaque-300 approximately 60 ml.  Findings:  The right vertebral artery origin is normal.  The vessel opacifies to the cranial skull base.  There is a diffuse tapered moderately severe narrowing of the right vertebrobasilar junction just proximal to the right posterior inferior cerebellar artery, and to a lesser degree just distal to it.  The basilar artery, the posterior cerebral arteries, the superior cerebellar arteries and the anterior-inferior  cerebellar arteries opacify normally into the capillary and venous phases.  Unopacified blood is seen in the basilar artery from a contralateral vertebral artery.  The right common carotid arteriogram demonstrates the right external carotid artery and its major branches to be normal.  The right internal carotid artery at the bulb to the cranial skull base also opacifies normally.  The petrous and the cavernous segments are normal.  There is a mild circumferential narrowing of the right ICA supraclinoid segment.  The right middle cerebral artery and the right anterior cerebral artery also demonstrate mild diffuse narrowing proximally.  Distal to this, the right MCA trifurcation branches and the right anterior cerebral artery distal to the A2 segments are seen to opacify normally into the capillary and venous phases.  The left right common carotid arteriogram demonstrates the left external carotid artery and its major branches to be normal.  The left internal carotid artery at the bulb to the cranial skull base is also normal.  The petrous and the cavernous segment are normal.  Mild circumferential narrowing of the left ICA supraclinoid segment is seen.  Also demonstrated is mild proximal tapered narrowing of the left anterior cerebral artery A1 segment.  The A2 segment opacifies normally.  The left middle cerebral artery is seen to opacify normally into the capillary and venous phases.  The left vertebral artery origin is normal.  The vessel opacifies normally to the cranial skull base.  There is again diffuse moderately severe tapered narrowing of the left vertebrobasilar junction proximal and distal to the left posterior-inferior cerebellar artery.  Mild narrowing of the left posterior-inferior cerebellar artery proximally is also seen.  The basilar artery, the posterior cerebral arteries, the superior cerebellar arteries and the anterior-inferior cerebellar arteries opacify normally into the capillary and venous  phases.  IMPRESSION: 1.  Moderately severe tapered narrowing of the vertebrobasilar junctions bilaterally, extending into the left posterior-inferior cerebral artery proximally.  This would be consistent with vasospasm given the clinical context and compared to the previous examination. 2.  Mild diffuse narrowing of the internal carotid arteries and the supraclinoid segments, and also of the proximal right-sided middle cerebral artery and the right anterior cerebral artery, and the proximal left anterior cerebral artery. 3.  No angiographic evidence  of intracranial aneurysm, arteriovenous malformation, dural AV fistula or of dissections seen.  Original Report Authenticated By: Oneal Grout, M.D.   Ir Angio Vertebral Sel Vertebral Bilat Mod Sed  12/22/2011  *RADIOLOGY REPORT*  Clinical Data: Acute onset of headaches, nausea, vomiting.  CT scan of the brain revealing subarachnoid hemorrhage.  BILATERAL COMMON CAROTID AND BILATERAL VERTEBRAL ARTERY ANGIOGRAMS  Comparison: CT of the brain of 12/16/2011.  Following a full explanation of the procedure along with the potential associated complications, an informed witnessed consent was obtained.  The right groin was prepped and draped in the usual sterile fashion.  Thereafter using modified Seldinger technique, transfemoral access into the right common femoral artery was obtained without difficulty.  Over a 0.035-inch guidewire, a 5- [place] Pinnacle sheath was inserted.  Through this and also over a 0.035-inch guidewire, a 5-French JB1 catheter was advanced to the aortic arch region and selectively positioned in the right common carotid artery, the right vertebral artery, the left common carotid artery and the left vertebral artery.  There were no acute complications.  The patient tolerated the procedure well.  Medications utilized: Versed 4 mg IV.  Dilaudid 2 mg IV.  Fentanyl 100 mcg IV.  Contrast: Omnipaque-300 approximately 60 ml.  Findings:  The right  vertebral artery origin is normal.  The vessel is seen to opacify normally to the cranial skull base.  There is normal opacification of the right posterior-inferior cerebellar artery and the right vertebrobasilar junction.  The opacified portion of the basilar artery, the posterior cerebral arteries, the superior cerebellar arteries and the anterior- inferior cerebellar arteries is normal into the capillary and venous phases.  Unopacified blood is seen in the basilar artery from the contralateral vertebral artery.  The right common carotid arteriogram demonstrates the right external carotid artery and its major branches to be normal.  The right internal carotid artery at the bulb to the cranial skull base opacifies normally.  The petrous, the cavernous and the supraclinoid segments are normal.  The right middle and the right anterior cerebral arteries opacify normally into the capillary and venous phases.  The left common carotid arteriogram demonstrates the left external carotid artery and its major branches to be normal.  The left internal carotid artery at the bulb to the cranial skull base opacifies normally.  The petrous, the cavernous and the supraclinoid segments are normal.  The left middle and the left anterior cerebral arteries opacify normally into the capillary and venous phases.  The left vertebral artery origin is normal.  The vessel opacifies normally to the cranial skull base.  There is normal opacification of the left posterior-inferior cerebellar artery and the left vertebrobasilar junction.  The basilar artery, the posterior cerebral arteries, the superior cerebellar arteries and the anterior-inferior cerebellar arteries opacify normally into the capillary and venous phases.  Unopacified blood is seen in the basilar artery from the contralateral vertebral artery.  IMPRESSION: 1.  Angiographically no evidence of an aneurysm, intracranial dissection, stenosis, arteriovenous malformation or of dural  AV fistula.  2.  Venous return within normal limits.  Original Report Authenticated By: Oneal Grout, M.D.    Antibiotics:  Anti-infectives     Start     Dose/Rate Route Frequency Ordered Stop   12/17/11 1400   cefTRIAXone (ROCEPHIN) 1 g in dextrose 5 % 50 mL IVPB  Status:  Discontinued        1 g 100 mL/hr over 30 Minutes Intravenous Every 24 hours 12/17/11 1302 01/04/12 0831  12/16/11 0400   azithromycin (ZITHROMAX) 500 mg in dextrose 5 % 250 mL IVPB        500 mg 250 mL/hr over 60 Minutes Intravenous Every 24 hours 12/16/11 0313 12/17/11 0506          Discharge Exam: Blood pressure 94/58, pulse 71, temperature 98.2 F (36.8 C), temperature source Oral, resp. rate 16, height 5\' 6"  (1.676 m), weight 75.4 kg (166 lb 3.6 oz), SpO2 93.00%. Neurologic: Grossly normal, awake alert ambulatory and conversant  Discharge Medications:   Medication List  As of 01/07/2012  6:34 AM   STOP taking these medications         azithromycin 250 MG tablet      predniSONE 10 MG tablet         TAKE these medications         HYDROcodone-homatropine 5-1.5 MG/5ML syrup   Commonly known as: HYCODAN   Take 5 mLs by mouth every 6 (six) hours as needed. For cough      VENTOLIN HFA 108 (90 BASE) MCG/ACT inhaler   Generic drug: albuterol   Inhale 2 puffs into the lungs every 6 (six) hours as needed.            Disposition: Home   Final Dx: [facility]  Discharge Orders    Future Orders Please Complete By Expires   Diet - low sodium heart healthy      Increase activity slowly      Driving Restrictions      Comments:   Until cleared by Newell Coral   Other Restrictions      Comments:   No strenuous activity   Call MD for:  temperature >100.4      Call MD for:  persistant nausea and vomiting      Call MD for:  severe uncontrolled pain         Follow-up Information    Follow up with Hewitt Shorts, MD. Schedule an appointment as soon as possible for a visit in 5 days.   Contact  information:   [address] [place] [address] [phone]           Signed: Tia Alert 01/07/2012, 6:34 AM

## 2012-01-07 NOTE — Progress Notes (Signed)
Danielle Riddle is a 66 y.o. female admitted on 12/16/2011 with headache, n/v, photophobia, and confusion.  Found to have SAH.  Had tx for asthma exacerbation, and sinusitis prior to admit (Followed by Dr. Shelle Iron as outpt).  PCCM consulted 5/3 for medical management while in ICU.  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 Sinus- Normalization of ventricular size.<BR>2. Minimal residual subarachnoid and intraventricular blood, products continue improve.<3. No new hemorrhage or acute abnormality.<BR>4. Sinus disease has improved since the prior study as well 5/20 TCD Elevated left middle cerebral mean flow velocities suggest early mild vasospasm. Improvement in bilateral vertebral vasospasm with now normal mean flow velocities cpmpared with TCD 12/30/11. 5/22  Ventriculostomy / CVL d/c'd  SUBJECTIVE: Cough persists  , not on asthma Rx  OBJECTIVE:  Blood pressure 108/55, pulse 73, temperature 99.1 F (37.3 C), temperature source Oral, resp. rate 15, height 5\' 6"  (1.676 m), weight 75.4 kg (166 lb 3.6 oz), SpO2 93.00%. Wt Readings from Last 3 Encounters:  01/02/12 75.4 kg (166 lb 3.6 oz)  12/14/11 64.955 kg (143 lb 3.2 oz)  09/20/11 65.046 kg (143 lb 6.4 oz)   Body mass index is 26.83 kg/(m^2).  I/O last 3 completed shifts: In: 2602 [P.O.:2410; I.V.:190; IV Piggyback:2] Out: 4020 [Urine:4020]  Physical Exam: General - No distress HEENT - CVL has been removed Cardiac - regular, nomurmurs Chest - bilat rhonchi/ exp  wheeze Abd - soft, nontender, no r/g Ext - no edema Neuro - awake, normal strength all ext, chair, she walked th icu   Lab 01/05/12 0335 01/04/12 0330 01/03/12 0433  NA 136 138 141  K 4.2 4.1 4.4  CL 95* 97 101  CO2 35* 32 33*  BUN 11 12 10   CREATININE 0.49* 0.52 0.48*  GLUCOSE 121* 189* 125*    Lab 01/01/12 0209  HGB 12.0  HCT 35.7*  WBC 10.5  PLT 303    ASSESSMENT/PLAN:  Subarachnoid hemorrhage. Ventriculostomy d/c'd 5/23. Likely out of the time period for vasospasm.  Headache, likely exacerbated by cough. Depakote d/c'd 5/23, Nimodipine d/c'd 5/24. plan -per neurosurgery For d/c home   Hyponatremia likely secondary to CSW, resolved   Acute on chronic sinusitis, resolved - Flonase - Claritine  Asthma with persistent cough, no acute bronchospasm.  Upper airway secretions. - flutter valve -resume asmanex 3puff daily -prednisone pulse -azithromycin x 5days  Constipation likely from narcotic use -senna, BM daily  Steroid induced hyperglycemia, resolved - no intervention needed at this time  Best practice -SCD and ambulation for DVT prophylaxis -Protonix for SUP, still required>>>d/c on this for 30days d/t cough   Ok for d/c Pulm f/u in one week with Dr Shelle Iron  Austin Miles  [phone]  Cell  [phone]  If no response or cell goes to voicemail, call beeper [phone]

## 2012-01-13 ENCOUNTER — Other Ambulatory Visit: Payer: Self-pay | Admitting: Neurosurgery

## 2012-01-13 ENCOUNTER — Encounter: Payer: Self-pay | Admitting: Pulmonary Disease

## 2012-01-13 ENCOUNTER — Ambulatory Visit (INDEPENDENT_AMBULATORY_CARE_PROVIDER_SITE_OTHER): Payer: PRIVATE HEALTH INSURANCE | Admitting: Pulmonary Disease

## 2012-01-13 ENCOUNTER — Telehealth: Payer: Self-pay | Admitting: Pulmonary Disease

## 2012-01-13 VITALS — BP 112/74 | HR 69 | Temp 98.3°F | Ht 66.5 in | Wt 136.4 lb

## 2012-01-13 DIAGNOSIS — I609 Nontraumatic subarachnoid hemorrhage, unspecified: Secondary | ICD-10-CM

## 2012-01-13 DIAGNOSIS — J45909 Unspecified asthma, uncomplicated: Secondary | ICD-10-CM

## 2012-01-13 MED ORDER — MONTELUKAST SODIUM 10 MG PO TABS: 10.0000 mg | ORAL_TABLET | Freq: Every day | ORAL | Status: DC

## 2012-01-13 NOTE — Patient Instructions (Signed)
Stop asmanex.  Will start on dulera 100/5  2 inhalations am and pm everyday no matter what.  Keep mouth rinsed. Can still use albuterol as needed, but if you are requiring it more than 2 times a week, I need to hear from you. Will start on singulair 10mg  one each am for allergies/sinus disease. Can take zyrtec 10mg  otc each day if needed for allergy symptom breakthrough. followup with me in 4 weeks, but call if not doing well.

## 2012-01-13 NOTE — Assessment & Plan Note (Signed)
The patient is much improved since her hospitalization for her subarachnoid hemorrhage, but is still having some postnasal drip and allergy symptoms.  This is leading to mild cough with small amounts of mucus production.  At this point, I suspect we have been under treating her asthma, and would like to change her inhaler regimen to dulera.  Will also add Singulair for her allergy symptoms, but need to keep in mind that she is prone to acute on chronic sinusitis.

## 2012-01-13 NOTE — Telephone Encounter (Signed)
Spoke with pt's spouse. He states that the pt was advised to start singulair, but the rx was never sent to pharm. I verified this by looking at the AVS and went ahead and sent in the prescription. Spouse states that there is nothing further needed.

## 2012-01-13 NOTE — Progress Notes (Signed)
  Subjective:    Patient ID: Danielle Riddle, female    DOB: 1946-01-07, 66 y.o.   MRN: [id number]  HPI The patient comes in today for followup of her known asthma and allergy.  She was recently in the hospital for subarachnoid hemorrhage, and this is felt possibly secondary to severe coughing.  She did have acute on chronic sinusitis while in the hospital, and this was treated appropriately.  She also had a mild asthma flare while in the hospital.   Review of Systems  Constitutional: Positive for appetite change. Negative for fever and unexpected weight change.  HENT: Positive for postnasal drip. Negative for ear pain, nosebleeds, congestion, sore throat, rhinorrhea, sneezing, trouble swallowing, dental problem and sinus pressure.   Eyes: Negative.  Negative for redness and itching.  Respiratory: Negative.  Negative for cough, chest tightness, shortness of breath and wheezing.   Cardiovascular: Negative.  Negative for palpitations and leg swelling.  Gastrointestinal: Negative.  Negative for nausea and vomiting.  Genitourinary: Negative.  Negative for dysuria.  Musculoskeletal: Negative.  Negative for joint swelling.  Skin: Negative.  Negative for rash.  Neurological: Positive for headaches.  Hematological: Negative.  Does not bruise/bleed easily.  Psychiatric/Behavioral: Negative.  Negative for dysphoric mood. The patient is not nervous/anxious.        Objective:   Physical Exam Thin female in no acute distress Nose without purulence or discharge noted Chest totally clear to auscultation, no wheezing Cardiac exam with regular rate and rhythm Lower extremities without edema, cyanosis Alert and oriented, moves all 4 extremities.       Assessment & Plan:

## 2012-01-20 ENCOUNTER — Ambulatory Visit
Admission: RE | Admit: 2012-01-20 | Discharge: 2012-01-20 | Disposition: A | Discharge status: Home / Self Care | Payer: PRIVATE HEALTH INSURANCE | Source: Ambulatory Visit | Attending: Neurosurgery | Admitting: Neurosurgery

## 2012-01-20 DIAGNOSIS — I609 Nontraumatic subarachnoid hemorrhage, unspecified: Secondary | ICD-10-CM

## 2012-01-24 ENCOUNTER — Other Ambulatory Visit: Payer: Self-pay | Admitting: Neurosurgery

## 2012-01-24 DIAGNOSIS — I6200 Nontraumatic subdural hemorrhage, unspecified: Secondary | ICD-10-CM

## 2012-02-06 ENCOUNTER — Encounter: Payer: Self-pay | Admitting: Pulmonary Disease

## 2012-02-06 ENCOUNTER — Ambulatory Visit (INDEPENDENT_AMBULATORY_CARE_PROVIDER_SITE_OTHER): Payer: PRIVATE HEALTH INSURANCE | Admitting: Pulmonary Disease

## 2012-02-06 VITALS — BP 110/78 | HR 61 | Temp 97.9°F | Ht 66.0 in | Wt 138.4 lb

## 2012-02-06 DIAGNOSIS — J45909 Unspecified asthma, uncomplicated: Secondary | ICD-10-CM

## 2012-02-06 MED ORDER — MOMETASONE FURO-FORMOTEROL FUM 100-5 MCG/ACT IN AERO: 2.0000 | INHALATION_SPRAY | Freq: Two times a day (BID) | RESPIRATORY_TRACT | Status: DC

## 2012-02-06 MED ORDER — OMEPRAZOLE 40 MG PO CPDR: 40.0000 mg | DELAYED_RELEASE_CAPSULE | Freq: Every day | ORAL | Status: DC

## 2012-02-06 NOTE — Assessment & Plan Note (Signed)
The patient has continued to improve slowly over time, and although Danielle Riddle cough is persistent, it is down to a 3/10.  Danielle Riddle asthma appears to be controlled on dulera, and I suspect a lot of Danielle Riddle persistent cough is more upper airway in origin.  She does have a history of pansinusitis, and this may need to be followed if the cough does not totally resolve.  She may also have an allergy component to this, and would check rast testing if she continues to have issues.  She is also being treated empirically for reflux, and I have reviewed the behavioral therapies with Danielle Riddle for cyclical coughing.

## 2012-02-06 NOTE — Progress Notes (Signed)
  Subjective:    Patient ID: Danielle Riddle, female    DOB: [birth year], 66 y.o.   MRN: [id number]  HPI The pt comes in for f/u of her known asthma.  This is complicated by chronic sinus disease, and possibly allergic disease.  She has done well on her regimen of dulera, singulair, and topical nasal CS.  She still has cough, but is improved from her last visit.  This seems to be getting better per pt.  She does have postnasal drip and possible reflux, and describes a globus sensation.  Her doe and exertional tolerance is better.     Review of Systems  Constitutional: Negative.  Negative for fever and unexpected weight change.  HENT: Positive for voice change. Negative for ear pain, nosebleeds, congestion, sore throat, rhinorrhea, sneezing, trouble swallowing, dental problem, postnasal drip and sinus pressure.   Eyes: Negative.  Negative for redness and itching.  Respiratory: Positive for cough and wheezing. Negative for chest tightness and shortness of breath.   Cardiovascular: Positive for leg swelling. Negative for palpitations.  Gastrointestinal: Negative.  Negative for nausea and vomiting.  Genitourinary: Negative.  Negative for dysuria.  Musculoskeletal: Negative.  Negative for joint swelling.  Skin: Negative.  Negative for rash.  Neurological: Negative.  Negative for headaches.  Hematological: Negative.  Does not bruise/bleed easily.  Psychiatric/Behavioral: Negative.  Negative for dysphoric mood. The patient is not nervous/anxious.        Objective:   Physical Exam Wd female in nad Nose without purulence or discharge noted. Chest with clear bs, no wheezing or rhonchi Cor with rrr LE without significant edema Alert and oriented, moves all 4.        Assessment & Plan:

## 2012-02-06 NOTE — Patient Instructions (Addendum)
Stay on current medications, but call if you are worsening. Will see you back in 2 mos.

## 2012-02-23 ENCOUNTER — Ambulatory Visit
Admission: RE | Admit: 2012-02-23 | Discharge: 2012-02-23 | Disposition: A | Discharge status: Home / Self Care | Payer: PRIVATE HEALTH INSURANCE | Source: Ambulatory Visit | Attending: Neurosurgery | Admitting: Neurosurgery

## 2012-02-23 DIAGNOSIS — I6200 Nontraumatic subdural hemorrhage, unspecified: Secondary | ICD-10-CM

## 2012-02-23 MED ORDER — IOHEXOL 350 MG/ML SOLN
100.0000 mL | Freq: Once | INTRAVENOUS | Status: AC | PRN
  Administered 2012-02-23: 100 mL via INTRAVENOUS

## 2012-02-23 MED ORDER — GADOBENATE DIMEGLUMINE 529 MG/ML IV SOLN
12.0000 mL | Freq: Once | INTRAVENOUS | Status: AC | PRN
  Administered 2012-02-23: 12 mL via INTRAVENOUS

## 2012-04-06 ENCOUNTER — Other Ambulatory Visit: Payer: Self-pay | Admitting: Neurosurgery

## 2012-04-06 DIAGNOSIS — I609 Nontraumatic subarachnoid hemorrhage, unspecified: Secondary | ICD-10-CM

## 2012-04-09 ENCOUNTER — Ambulatory Visit (INDEPENDENT_AMBULATORY_CARE_PROVIDER_SITE_OTHER): Payer: PRIVATE HEALTH INSURANCE | Admitting: Pulmonary Disease

## 2012-04-09 ENCOUNTER — Encounter: Payer: Self-pay | Admitting: Pulmonary Disease

## 2012-04-09 VITALS — BP 118/72 | HR 62 | Temp 98.2°F | Ht 66.0 in | Wt 140.0 lb

## 2012-04-09 DIAGNOSIS — J45909 Unspecified asthma, uncomplicated: Secondary | ICD-10-CM

## 2012-04-09 MED ORDER — PREDNISONE 10 MG PO TABS: ORAL_TABLET | ORAL | Status: DC

## 2012-04-09 MED ORDER — CEFDINIR 300 MG PO CAPS: ORAL_CAPSULE | ORAL | Status: DC

## 2012-04-09 NOTE — Progress Notes (Signed)
  Subjective:    Patient ID: Danielle Riddle, female    DOB: 05/09/1946, 66 y.o.   MRN: [id number]  HPI Patient comes in today for followup of her known asthma.  She also has had issues with sinusitis, although a recent followup CT of her head showed no sinus disease.  She has been doing very well from an asthma standpoint on her current regimen, and has not required her rescue inhaler.  Her cough has totally resolved, and she has no congestion.  She is going on a trip to [place], is concerned about getting sick while there.   Review of Systems  Constitutional: Negative for fever and unexpected weight change.  HENT: Positive for sore throat and rhinorrhea. Negative for ear pain, nosebleeds, congestion, sneezing, trouble swallowing, dental problem, postnasal drip and sinus pressure.   Eyes: Negative for redness and itching.  Respiratory: Negative for cough, chest tightness, shortness of breath and wheezing.   Cardiovascular: Negative for palpitations and leg swelling.  Gastrointestinal: Negative for nausea and vomiting.  Genitourinary: Negative for dysuria.  Musculoskeletal: Negative for joint swelling.  Skin: Negative for rash.  Neurological: Negative for headaches.  Hematological: Bruises/bleeds easily.  Psychiatric/Behavioral: Negative for dysphoric mood. The patient is not nervous/anxious.        Objective:   Physical Exam Well-developed female in no acute distress Nose without purulence or discharge noted Chest clear to auscultation, no wheezing Cardiac exam is regular rate and rhythm Lower extremities without edema        Assessment & Plan:

## 2012-04-09 NOTE — Assessment & Plan Note (Signed)
The patient is doing very well from a pulmonary standpoint, with no rescue inhaler use and no evidence for persistent sinusitis on a recent CT head.  I've asked her to continue on her current regimen, and we'll give her a prescription for prednisone and an antibiotic to take with her to [place] in the event that she gets sick.  It has been unclear whether allergy is playing a role with her overall respiratory issues, and the patient would like to have an allergy evaluation.  We will make the referral once she gets back from [place] if she is not sick.

## 2012-04-09 NOTE — Patient Instructions (Addendum)
No change in maintenance medication. Will give you prescriptions for prednisone and an antibiotic to take with you on trip to [place].  Do not take unless you are getting sick.  Please call us if this occurs so we know. If doing well, followup with me in 4mos.

## 2012-05-08 ENCOUNTER — Ambulatory Visit
Admission: RE | Admit: 2012-05-08 | Discharge: 2012-05-08 | Disposition: A | Discharge status: Home / Self Care | Payer: PRIVATE HEALTH INSURANCE | Source: Ambulatory Visit | Attending: Neurosurgery | Admitting: Neurosurgery

## 2012-05-08 DIAGNOSIS — I609 Nontraumatic subarachnoid hemorrhage, unspecified: Secondary | ICD-10-CM

## 2012-05-14 ENCOUNTER — Other Ambulatory Visit: Payer: Self-pay | Admitting: Obstetrics and Gynecology

## 2012-05-14 DIAGNOSIS — Z1231 Encounter for screening mammogram for malignant neoplasm of breast: Secondary | ICD-10-CM

## 2012-06-07 ENCOUNTER — Encounter: Payer: Self-pay | Admitting: Gynecology

## 2012-06-07 DIAGNOSIS — S0990XA Unspecified injury of head, initial encounter: Secondary | ICD-10-CM | POA: Insufficient documentation

## 2012-06-12 ENCOUNTER — Ambulatory Visit
Admission: RE | Admit: 2012-06-12 | Discharge: 2012-06-12 | Disposition: A | Discharge status: Home / Self Care | Payer: PRIVATE HEALTH INSURANCE | Source: Ambulatory Visit | Attending: Obstetrics and Gynecology | Admitting: Obstetrics and Gynecology

## 2012-06-12 DIAGNOSIS — Z1231 Encounter for screening mammogram for malignant neoplasm of breast: Secondary | ICD-10-CM

## 2012-06-27 ENCOUNTER — Encounter: Payer: Self-pay | Admitting: Obstetrics and Gynecology

## 2012-06-27 ENCOUNTER — Ambulatory Visit (INDEPENDENT_AMBULATORY_CARE_PROVIDER_SITE_OTHER): Payer: PRIVATE HEALTH INSURANCE | Admitting: Obstetrics and Gynecology

## 2012-06-27 VITALS — BP 124/74 | Ht 67.0 in

## 2012-06-27 DIAGNOSIS — Z01419 Encounter for gynecological examination (general) (routine) without abnormal findings: Secondary | ICD-10-CM

## 2012-06-27 NOTE — Progress Notes (Signed)
Patient came to see me today for her annual GYN exam. Since I last saw her she had a severe subarachnoid hemorrhage and was in the ICU for 3 weeks. Other than drain no surgery was done. An etiology for the bleeding was never found. Gynecologically  She is doing well. She is not having menopausal symptoms. She is not sexually active. She is up-to-date on mammograms. She had a bone density several years ago which was normal. She has always had normal Pap smears. Her last Pap smear was 2011. She is not having any vaginal bleeding. She is having no pelvic pain. She is having no bladder symptoms.  Physical examination:Danielle Riddle present. HEENT within normal limits. Neck: Thyroid not large. No masses. Supraclavicular nodes: not enlarged. Breasts: Examined in both sitting and lying  position. No skin changes and no masses. Abdomen: Soft no guarding rebound or masses or hernia. Pelvic: External: Within normal limits. BUS: Within normal limits. Vaginal:within normal limits. Good estrogen effect. No evidence of cystocele rectocele or enterocele. Cervix: clean. Uterus: Normal size and shape. Adnexa: No masses. Rectovaginal exam: Confirmatory and negative. Extremities: Within normal limits.  Assessment: Normal GYN exam  Plan: Continue yearly mammograms. Pap not done.The new Pap smear guidelines were discussed with the patient.

## 2012-06-27 NOTE — Patient Instructions (Signed)
Continue yearly mammograms 

## 2012-06-28 LAB — URINALYSIS W MICROSCOPIC + REFLEX CULTURE
Bacteria, UA: NONE SEEN
Casts: NONE SEEN
Hgb urine dipstick: NEGATIVE
Ketones, ur: NEGATIVE mg/dL
Leukocytes, UA: NEGATIVE
Nitrite: NEGATIVE
Protein, ur: NEGATIVE mg/dL
Urobilinogen, UA: 0.2 mg/dL (ref 0.0–1.0)

## 2012-07-06 ENCOUNTER — Telehealth: Payer: Self-pay | Admitting: Pulmonary Disease

## 2012-07-06 MED ORDER — MONTELUKAST SODIUM 10 MG PO TABS: 10.0000 mg | ORAL_TABLET | Freq: Every day | ORAL | Status: DC

## 2012-07-06 NOTE — Telephone Encounter (Signed)
rx for the singulair has been sent in to the pharmacy.  This was accidentally sent in under SN and i called and spoke with the pharmacy and they will change this over to [facility].  Called and lmom to make her aware.

## 2012-07-24 ENCOUNTER — Telehealth: Payer: Self-pay | Admitting: Pulmonary Disease

## 2012-07-24 NOTE — Telephone Encounter (Signed)
LMOMTCB x 1. Pt scheduled for f/u on 08/16/12.

## 2012-07-24 NOTE — Telephone Encounter (Signed)
Pt c/o PND, sore throat and hacking cough x 1-2 days. She denies any sob, wheezing or chest tightness. She says that both ears are starting to hurt. She still has an Advertising account executive for [employer] that [facility] wrote for her back in 03/2012 abd she would like to know if she should fill this. She does not want this to turn into something more. Pls advise.

## 2012-07-24 NOTE — Telephone Encounter (Signed)
Pt advised of recs.Latrish Mogel, CMA  

## 2012-07-24 NOTE — Telephone Encounter (Signed)
This could be viral or bacterial.  Given her history, I would hedge toward the safer side for now.  Ok to start omnicef.  Also can do sinus rinses, and take a decongestant otc for no more than 4-5 days.  Let us know if not getting better.

## 2012-07-24 NOTE — Telephone Encounter (Signed)
Pt returned call. Danielle Riddle °

## 2012-07-27 ENCOUNTER — Encounter: Payer: Self-pay | Admitting: Pulmonary Disease

## 2012-07-27 ENCOUNTER — Ambulatory Visit (INDEPENDENT_AMBULATORY_CARE_PROVIDER_SITE_OTHER): Payer: PRIVATE HEALTH INSURANCE | Admitting: Pulmonary Disease

## 2012-07-27 VITALS — BP 102/62 | HR 78 | Temp 97.9°F | Ht 64.0 in | Wt 140.0 lb

## 2012-07-27 DIAGNOSIS — J45909 Unspecified asthma, uncomplicated: Secondary | ICD-10-CM

## 2012-07-27 NOTE — Assessment & Plan Note (Signed)
The patient has done well from an asthma standpoint, but most recently has had increasing upper airway symptoms consistent with either a viral URI or a recurrence of her sinusitis.  Because she can be frail at times with respect to worsening asthma, I have asked her to go ahead and start taking an antibiotic.  We'll try to hold off on steroids since her lungs were clear, but if her cough and congestion persists, I would treat her with a 6-8 day taper.  Because of her history of sinusitis and asthma, we talked about an allergy evaluation, and I will make that referral today.

## 2012-07-27 NOTE — Progress Notes (Signed)
  Subjective:    Patient ID: Danielle Riddle, female    DOB: 09-Apr-1946, 66 y.o.   MRN: [id number]  HPI Patient comes in today for followup of her known asthma with recent worsening cough and nasal congestion.  She has had significant postnasal drip and some purulence from her nose.  She has had a lot of sinus pressure and congestion.  She has also had a sore throat, but no increased shortness of breath as of yet.  Her main complaint is her cough with rattling in the back of her throat.  She had some antibiotics on hand and from a recent trip, and I asked her to go ahead and take the [place].  We had discussed allergy testing in the past, and I think this would be appropriate considering her sinopulmonary symptoms.   Review of Systems  Constitutional: Negative for fever and unexpected weight change.  HENT: Positive for congestion, sore throat, rhinorrhea, trouble swallowing, postnasal drip and sinus pressure. Negative for ear pain, nosebleeds, sneezing and dental problem.   Eyes: Negative for redness and itching.  Respiratory: Positive for cough and wheezing. Negative for chest tightness and shortness of breath.   Cardiovascular: Negative for palpitations and leg swelling.  Gastrointestinal: Negative for nausea and vomiting.  Genitourinary: Negative for dysuria.  Musculoskeletal: Negative for joint swelling.  Skin: Negative for rash.  Neurological: Positive for headaches.  Hematological: Does not bruise/bleed easily.  Psychiatric/Behavioral: Negative for dysphoric mood. The patient is not nervous/anxious.        Objective:   Physical Exam Thin female in no acute distress Nose with mild inflammatory changes and edema, no definite purulence Oropharynx with no exudates or erythema Neck without lymphadenopathy or thyromegaly Chest totally clear with no wheezes Cardiac exam was regular rate and rhythm Lower extremities without edema, cyanosis Alert and oriented, moves all 4  extremities.       Assessment & Plan:

## 2012-07-27 NOTE — Patient Instructions (Addendum)
Finish up course of antibiotics. Start taking your prednisone if you feel your chest symptoms are worsening. No change in maintenance medications. Will refer you to Dr. Maple Hudson for allergy evaluation given your asthma and episodes of sinusitis. Change your followup apptm with me to 4 mos, but call me if you are not returning to baseline.

## 2012-08-07 ENCOUNTER — Ambulatory Visit: Payer: PRIVATE HEALTH INSURANCE | Admitting: Pulmonary Disease

## 2012-08-09 ENCOUNTER — Ambulatory Visit: Payer: PRIVATE HEALTH INSURANCE | Admitting: Pulmonary Disease

## 2012-08-14 ENCOUNTER — Telehealth: Payer: Self-pay | Admitting: Pulmonary Disease

## 2012-08-14 MED ORDER — OMEPRAZOLE 40 MG PO CPDR: 40.0000 mg | DELAYED_RELEASE_CAPSULE | Freq: Every day | ORAL | Status: DC

## 2012-08-14 NOTE — Telephone Encounter (Signed)
Spoke with Shanda Bumps, from [facility]-- patient req rx for prilosec sent in.  Rx sent nothing further needed.

## 2012-08-16 ENCOUNTER — Ambulatory Visit: Payer: PRIVATE HEALTH INSURANCE | Admitting: Pulmonary Disease

## 2012-09-04 ENCOUNTER — Telehealth: Payer: Self-pay | Admitting: Pulmonary Disease

## 2012-09-04 ENCOUNTER — Ambulatory Visit (INDEPENDENT_AMBULATORY_CARE_PROVIDER_SITE_OTHER): Payer: PRIVATE HEALTH INSURANCE | Admitting: Internal Medicine

## 2012-09-04 ENCOUNTER — Other Ambulatory Visit (INDEPENDENT_AMBULATORY_CARE_PROVIDER_SITE_OTHER): Payer: PRIVATE HEALTH INSURANCE

## 2012-09-04 ENCOUNTER — Encounter: Payer: Self-pay | Admitting: Internal Medicine

## 2012-09-04 VITALS — BP 130/70 | HR 64 | Ht 66.0 in

## 2012-09-04 DIAGNOSIS — J45909 Unspecified asthma, uncomplicated: Secondary | ICD-10-CM

## 2012-09-04 DIAGNOSIS — J310 Chronic rhinitis: Secondary | ICD-10-CM

## 2012-09-04 DIAGNOSIS — J302 Other seasonal allergic rhinitis: Secondary | ICD-10-CM

## 2012-09-04 DIAGNOSIS — J309 Allergic rhinitis, unspecified: Secondary | ICD-10-CM

## 2012-09-04 MED ORDER — MOMETASONE FURO-FORMOTEROL FUM 100-5 MCG/ACT IN AERO: 2.0000 | INHALATION_SPRAY | Freq: Two times a day (BID) | RESPIRATORY_TRACT | Status: DC

## 2012-09-04 NOTE — Telephone Encounter (Signed)
Pt aware that rx has been sent in. 
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
## 2012-09-04 NOTE — Patient Instructions (Addendum)
Order- lab- Allergy Profile, ANA, Sed rate    Dx asthma, rhinitis  Please call as needed

## 2012-09-05 ENCOUNTER — Encounter: Payer: Self-pay | Admitting: Internal Medicine

## 2012-09-05 DIAGNOSIS — J302 Other seasonal allergic rhinitis: Secondary | ICD-10-CM | POA: Insufficient documentation

## 2012-09-05 LAB — ALLERGY FULL PROFILE
Allergen, D pternoyssinus,d7: <0.1 kU/L
Aspergillus fumigatus, IgG: <0.1 kU/L
Bahia Grass: <0.1 kU/L
Box Elder IgE: <0.1 kU/L
Cat Dander: <0.1 kU/L
Common Ragweed: <0.1 kU/L
D. farinae: <0.1 kU/L
Dog Dander: <0.1 kU/L
G005 Rye, Perennial: <0.1 kU/L
Goldenrod: <0.1 kU/L
Helminthosporium halodes: <0.1 kU/L
House Dust Hollister: <0.1 kU/L
Oak: <0.1 kU/L
Plantain: <0.1 kU/L
Sycamore Tree: <0.1 kU/L

## 2012-09-05 NOTE — Assessment & Plan Note (Signed)
She describes primary trigger as viral syndrome colds and indicates she is well at this time.

## 2012-09-05 NOTE — Assessment & Plan Note (Signed)
She minimizes allergic symptoms today. We can get Allergy Profile for IgE assessment. On the long-shot possibility that her SAH and asthma hx are linked by a vasculitis/ Churg-Strauss, w/o other rash, I am sending ANA, Sed rate.

## 2012-09-11 NOTE — Progress Notes (Signed)
Quick Note:  LMTCB ______ 

## 2012-09-12 ENCOUNTER — Telehealth: Payer: Self-pay | Admitting: Internal Medicine

## 2012-09-12 NOTE — Telephone Encounter (Signed)
Notes Recorded by Waymon Budge, MD on 09/05/2012 at 2:36 PM Allergy profile does not show significant allergy antibody levels. She might have some symptoms with heavy exposure, for instance to spring pollen, but does not seem to be unusually sensitive. The blood tests for vasculitis were normal- ANA and sed rate. ---  I spoke with patient about results and she verbalized understanding and had no questions

## 2012-10-15 ENCOUNTER — Other Ambulatory Visit: Payer: Self-pay | Admitting: Dermatology

## 2012-11-27 ENCOUNTER — Ambulatory Visit (INDEPENDENT_AMBULATORY_CARE_PROVIDER_SITE_OTHER): Payer: PRIVATE HEALTH INSURANCE | Admitting: Pulmonary Disease

## 2012-11-27 ENCOUNTER — Encounter: Payer: Self-pay | Admitting: Pulmonary Disease

## 2012-11-27 VITALS — BP 102/60 | HR 68 | Temp 97.9°F | Ht 66.5 in | Wt 134.0 lb

## 2012-11-27 DIAGNOSIS — J45909 Unspecified asthma, uncomplicated: Secondary | ICD-10-CM

## 2012-11-27 MED ORDER — PREDNISONE 10 MG PO TABS: ORAL_TABLET | ORAL | Status: DC

## 2012-11-27 MED ORDER — CEFDINIR 300 MG PO CAPS: 600.0000 mg | ORAL_CAPSULE | ORAL | Status: DC

## 2012-11-27 NOTE — Patient Instructions (Addendum)
Will give you prescriptions for omnicef and prednisone to take on your trip. No change in breathing medications. followup with me in 4mos, but call if you are having issues.

## 2012-11-27 NOTE — Assessment & Plan Note (Signed)
The patient is doing very well from an asthma standpoint, it has not required increased rescue inhaler use or a prednisone taper since last visit.  Her allergy symptoms seem to be under good control with her current regimen, and she has not had an episode of sinusitis.  I have asked her to continue on her current medical regimen, and to see me back in 4-6 months.

## 2012-11-27 NOTE — Progress Notes (Signed)
  Subjective:    Patient ID: Danielle Riddle, female    DOB: 16-Jul-1946, 67 y.o.   MRN: [id number]  HPI The patient comes in today for followup of her known asthma.  She's been doing very well on her current regimen, and has not had any flareups or episodes of sinusitis.  She feels that her allergies are reasonably controlled this spring.  She denies any significant cough or mucus production.  She has not had to use her rescue inhaler.   Review of Systems  Constitutional: Negative for fever and unexpected weight change.  HENT: Positive for voice change ( Patient still having issues with laryngitis and raspy voice) and sinus pressure. Negative for ear pain, nosebleeds, congestion, sore throat, rhinorrhea, sneezing, trouble swallowing, dental problem and postnasal drip.   Eyes: Negative for redness and itching.  Respiratory: Negative for cough, chest tightness, shortness of breath and wheezing.   Cardiovascular: Negative for palpitations and leg swelling.  Gastrointestinal: Negative for nausea and vomiting.  Genitourinary: Negative for dysuria.  Musculoskeletal: Negative for joint swelling.  Skin: Negative for rash.  Neurological: Negative for headaches.  Hematological: Does not bruise/bleed easily.  Psychiatric/Behavioral: Negative for dysphoric mood. The patient is not nervous/anxious.        Objective:   Physical Exam Well-developed female in no acute distress Nose without purulence or discharge noted Neck without lymphadenopathy or thyromegaly Chest totally clear to auscultation, no wheezes Cardiac exam with regular rate and rhythm Alert and oriented, moves all 4 extremities.       Assessment & Plan:

## 2012-12-18 ENCOUNTER — Other Ambulatory Visit: Payer: Self-pay | Admitting: Pulmonary Disease

## 2013-02-19 ENCOUNTER — Other Ambulatory Visit: Payer: Self-pay | Admitting: Pulmonary Disease

## 2013-03-27 ENCOUNTER — Ambulatory Visit (INDEPENDENT_AMBULATORY_CARE_PROVIDER_SITE_OTHER): Payer: Medicare Other | Admitting: Pulmonary Disease

## 2013-03-27 ENCOUNTER — Encounter: Payer: Self-pay | Admitting: Pulmonary Disease

## 2013-03-27 VITALS — BP 112/76 | HR 70 | Temp 98.1°F | Ht 66.0 in | Wt 135.0 lb

## 2013-03-27 DIAGNOSIS — J45909 Unspecified asthma, uncomplicated: Secondary | ICD-10-CM

## 2013-03-27 NOTE — Patient Instructions (Addendum)
No change in your medications Will give you a prescription for omnicef to take with you on your trip if needed. followup with me again in 6mos, but call if having issues.

## 2013-03-27 NOTE — Assessment & Plan Note (Signed)
Pt is stable from an asthma standpoint on current regimen.  I have asked her to stick with her nasal hygiene regimen if increased symptoms, and will give her a prescription for omnicef to hold on her upcoming trip to [place].

## 2013-03-27 NOTE — Progress Notes (Signed)
  Subjective:    Patient ID: Danielle Riddle, female    DOB: 10/31/1945, 67 y.o.   MRN: [id number]  HPI The pt comes in today for f/u of her known asthma, AR/chronic sinusitis.  She has been doing well from a pulmonary standpoint, with no increased symptoms or rescue inhaler use.  Her rhinitis has been controlled, and no purulent secretions from nares.   She is going on trip to [place] soon.    Review of Systems  Constitutional: Negative for fever and unexpected weight change.  HENT: Negative for ear pain, nosebleeds, congestion, sore throat, rhinorrhea, sneezing, trouble swallowing, dental problem, postnasal drip and sinus pressure.   Eyes: Negative for redness and itching.  Respiratory: Negative for cough, chest tightness, shortness of breath and wheezing.   Cardiovascular: Negative for palpitations and leg swelling.  Gastrointestinal: Negative for nausea and vomiting.  Genitourinary: Negative for dysuria.  Musculoskeletal: Negative for joint swelling.  Skin: Negative for rash.  Neurological: Negative for headaches.  Hematological: Does not bruise/bleed easily.  Psychiatric/Behavioral: Negative for dysphoric mood. The patient is not nervous/anxious.        Objective:   Physical Exam Wd female in nad Nose without purulence or d/c noted Chest totally clear to auscultation Cor with rrr LE without edema or cyanosis Alert and oriented, moves all 4.        Assessment & Plan:

## 2013-03-29 ENCOUNTER — Ambulatory Visit: Payer: PRIVATE HEALTH INSURANCE | Admitting: Pulmonary Disease

## 2013-04-11 ENCOUNTER — Other Ambulatory Visit: Payer: Self-pay | Admitting: Neurosurgery

## 2013-04-11 DIAGNOSIS — I609 Nontraumatic subarachnoid hemorrhage, unspecified: Secondary | ICD-10-CM

## 2013-04-23 ENCOUNTER — Ambulatory Visit
Admission: RE | Admit: 2013-04-23 | Discharge: 2013-04-23 | Disposition: A | Discharge status: Home / Self Care | Payer: Medicare Other | Source: Ambulatory Visit | Attending: Neurosurgery | Admitting: Neurosurgery

## 2013-04-23 DIAGNOSIS — I609 Nontraumatic subarachnoid hemorrhage, unspecified: Secondary | ICD-10-CM

## 2013-04-23 MED ORDER — GADOBENATE DIMEGLUMINE 529 MG/ML IV SOLN
12.0000 mL | Freq: Once | INTRAVENOUS | Status: AC | PRN
  Administered 2013-04-23: 12 mL via INTRAVENOUS

## 2013-04-24 ENCOUNTER — Other Ambulatory Visit: Payer: Self-pay | Admitting: Critical Care Medicine

## 2013-05-03 ENCOUNTER — Other Ambulatory Visit: Payer: Self-pay | Admitting: *Deleted

## 2013-05-03 MED ORDER — OMEPRAZOLE 40 MG PO CPDR: DELAYED_RELEASE_CAPSULE | ORAL | Status: DC

## 2013-05-03 MED ORDER — MONTELUKAST SODIUM 10 MG PO TABS: 10.0000 mg | ORAL_TABLET | ORAL | Status: DC

## 2013-05-14 ENCOUNTER — Other Ambulatory Visit: Payer: Self-pay | Admitting: Pulmonary Disease

## 2013-06-05 ENCOUNTER — Other Ambulatory Visit: Payer: Self-pay

## 2013-06-05 DIAGNOSIS — Z1231 Encounter for screening mammogram for malignant neoplasm of breast: Secondary | ICD-10-CM

## 2013-06-07 ENCOUNTER — Other Ambulatory Visit: Payer: Self-pay | Admitting: Pulmonary Disease

## 2013-06-20 ENCOUNTER — Other Ambulatory Visit: Payer: Self-pay

## 2013-07-01 ENCOUNTER — Other Ambulatory Visit: Payer: Self-pay | Admitting: Pulmonary Disease

## 2013-07-02 ENCOUNTER — Ambulatory Visit
Admission: RE | Admit: 2013-07-02 | Discharge: 2013-07-02 | Disposition: A | Discharge status: Home / Self Care | Payer: Medicare Other | Source: Ambulatory Visit

## 2013-07-02 DIAGNOSIS — Z1231 Encounter for screening mammogram for malignant neoplasm of breast: Secondary | ICD-10-CM

## 2013-07-16 ENCOUNTER — Encounter: Payer: Self-pay | Admitting: Gynecology

## 2013-08-20 ENCOUNTER — Ambulatory Visit (INDEPENDENT_AMBULATORY_CARE_PROVIDER_SITE_OTHER): Payer: Medicare Other | Admitting: Gynecology

## 2013-08-20 ENCOUNTER — Other Ambulatory Visit (HOSPITAL_COMMUNITY)
Admission: RE | Admit: 2013-08-20 | Discharge: 2013-08-20 | Disposition: A | Discharge status: Home / Self Care | Payer: Medicare Other | Source: Ambulatory Visit | Attending: Gynecology | Admitting: Gynecology

## 2013-08-20 ENCOUNTER — Encounter: Payer: Self-pay | Admitting: Gynecology

## 2013-08-20 VITALS — BP 120/74 | Ht 67.0 in

## 2013-08-20 DIAGNOSIS — Z124 Encounter for screening for malignant neoplasm of cervix: Secondary | ICD-10-CM | POA: Insufficient documentation

## 2013-08-20 DIAGNOSIS — N952 Postmenopausal atrophic vaginitis: Secondary | ICD-10-CM

## 2013-08-20 DIAGNOSIS — Z1151 Encounter for screening for human papillomavirus (HPV): Secondary | ICD-10-CM | POA: Insufficient documentation

## 2013-08-20 NOTE — Progress Notes (Signed)
Danielle Riddle 02-22-1946 [id number]        68 y.o.  G2P0202 for followup exam.  Former patient of Dr. Cherylann Banas. Several issues noted below.  Past medical history,surgical history, problem list, medications, allergies, family history and social history were all reviewed and documented in the EPIC chart.  ROS:  Performed and pertinent positives and negatives are included in the history, assessment and plan .  Exam: [name] assistant Filed Vitals:   08/20/13 1448  BP: 120/74  Height: 5\' 7"  (1.702 m)   General appearance  Normal Skin grossly normal Head/Neck normal with no cervical or supraclavicular adenopathy thyroid normal Lungs  clear Cardiac RR, without RMG Abdominal  soft, nontender, without masses, organomegaly or hernia Breasts  examined lying and sitting without masses, retractions, discharge or axillary adenopathy. Bilateral implants noted. Pelvic  Ext/BUS/vagina  Normal with general atrophic changes  Cervix  Normal with atrophic changes  Uterus  anteverted, normal size, shape and contour, midline and mobile nontender   Adnexa  Without masses or tenderness    Anus and perineum  Normal   Rectovaginal  Normal sphincter tone without palpated masses or tenderness.    Assessment/Plan:  68 y.o. G2P0202 female for annual exam.   1. Postmenopausal/atrophic genital changes. Patient is having some issues with vaginal dryness. Is not having significant hot flushes or night sweats. Is not sexually active. Options for vaginal dryness reviewed and declined. Patient's comfortable just observing at this time. 2. Mammography 06/2013. Bilateral implants. Continue with annual mammography. SBE monthly reviewed. 3. Pap smear 2011. Pap/HPV done today. No history of significant abnormal Pap smears. 4. DEXA 03/2010 normal. Recommend repeat that 5 year interval. Increase calcium vitamin D reviewed. 5. Colonoscopy 10 years ago. Due for repeat this coming year she's going to make arrangements for  this. 6. Health maintenance. No blood work done today as this is done through her primary physician's office. Followup in one year, sooner as needed.   Note: This document was prepared with digital dictation and possible smart phrase technology. Any transcriptional errors that result from this process are unintentional.   Danielle Auerbach MD, 3:12 PM 08/20/2013

## 2013-08-20 NOTE — Patient Instructions (Signed)
Follow-up in 1 year, sooner as needed. 

## 2013-08-20 NOTE — Addendum Note (Signed)
Addended by: Nelva Nay on: 08/20/2013 03:22 PM   Modules accepted: Orders

## 2013-09-10 IMAGING — XA IR ANGIO INTRA EXTRACRAN SEL COM CAROTID INNOMINATE BILAT MOD SE
1 series · 13 of 24 positions shown · IV contrast (IODINE)
Comparison: CT of the brain of 12/16/2011.

CLINICAL DATA: Acute onset of headaches, nausea, vomiting.  CT scan
of the brain revealing subarachnoid hemorrhage.

BILATERAL COMMON CAROTID AND BILATERAL VERTEBRAL ARTERY ANGIOGRAMS

[Series 300: neuro · 13 of 222 slices shown]
[im 1/222]
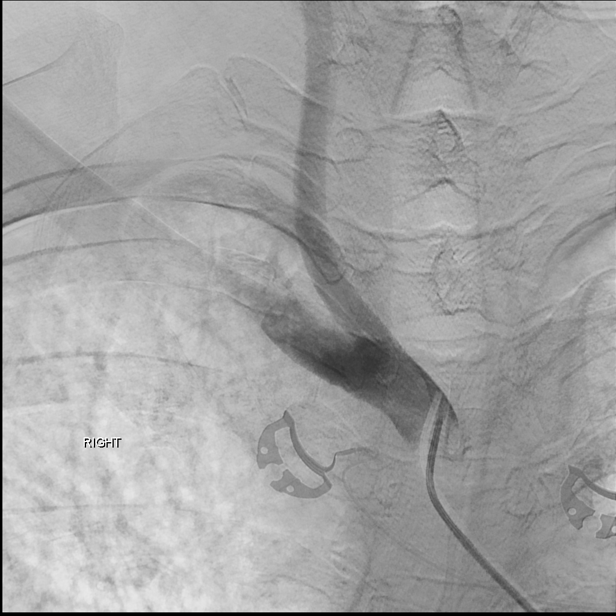
[im 20/222]
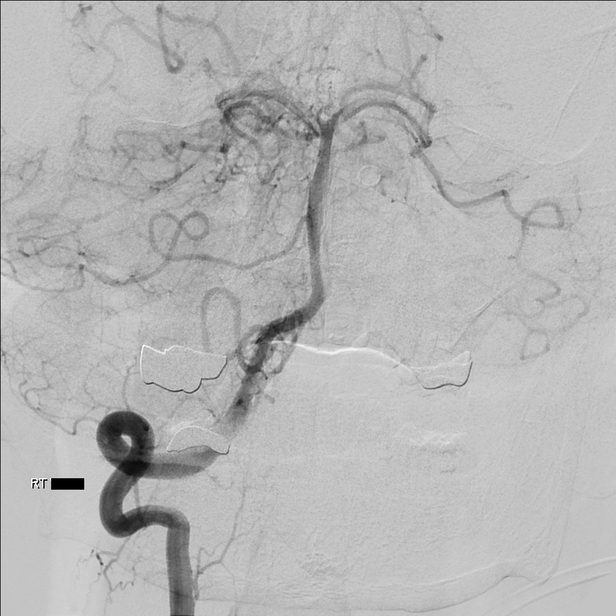
[im 39/222]
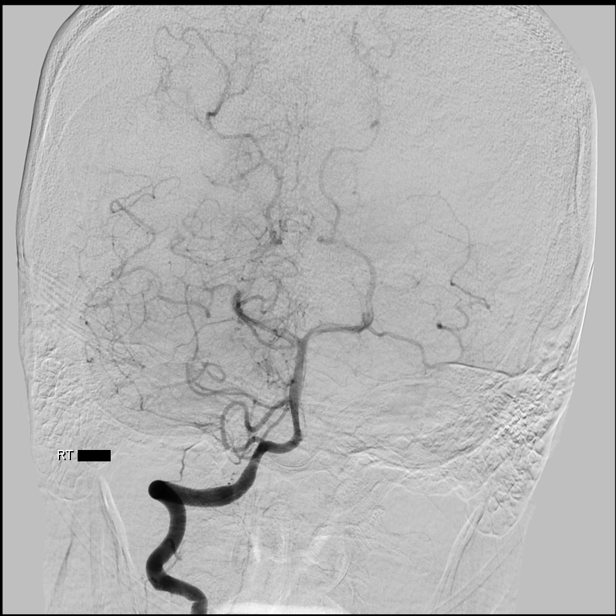
[im 58/222]
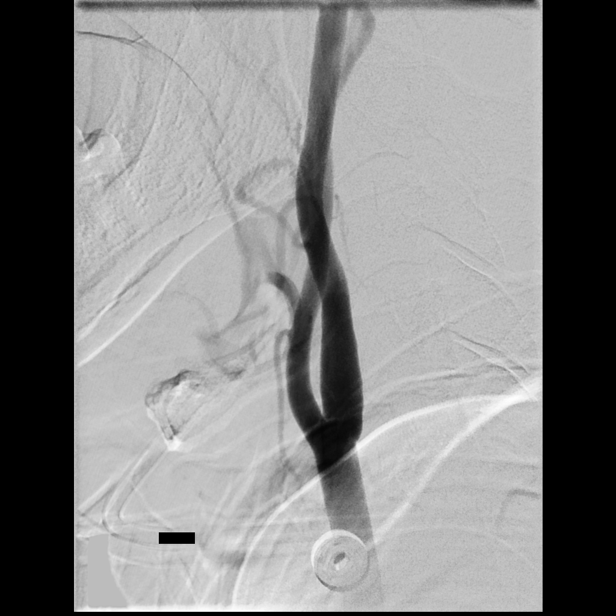
[im 77/222]
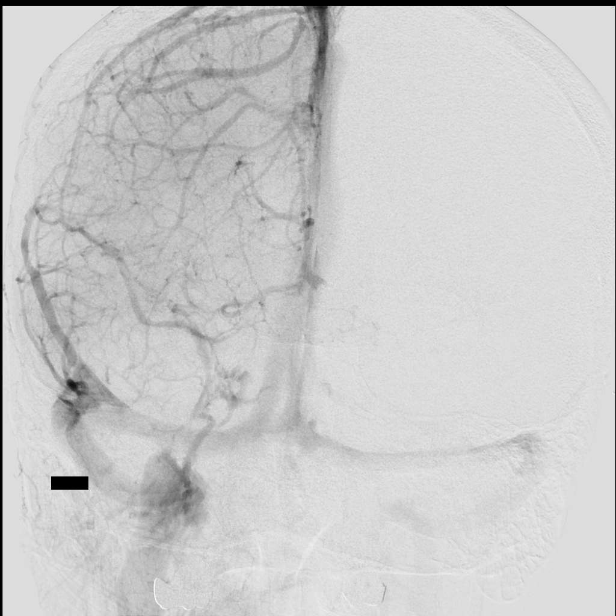
[im 97/222]
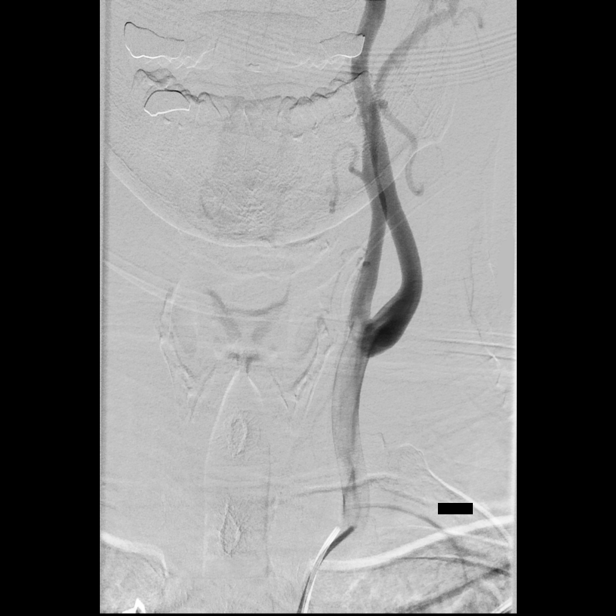
[im 116/222]
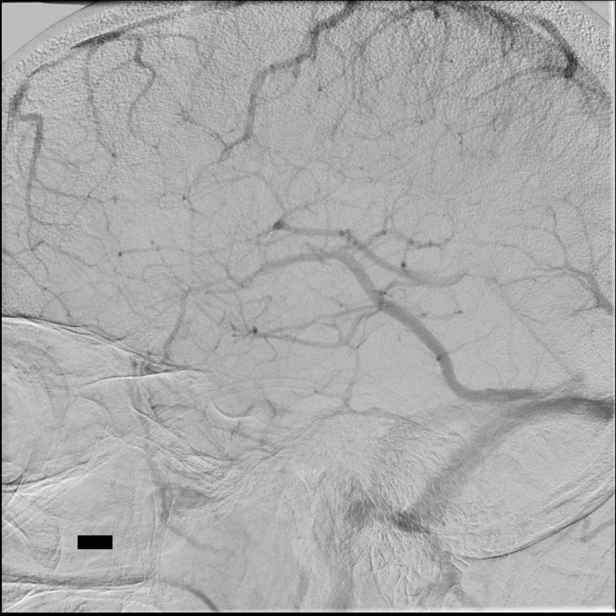
[im 125/222]
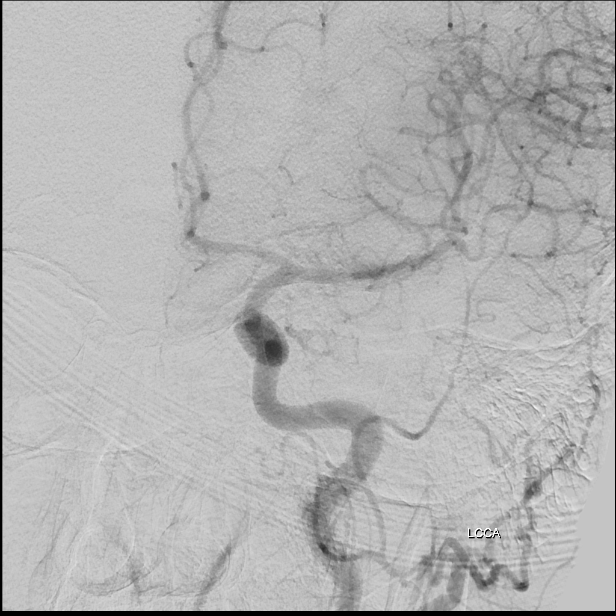
[im 145/222]
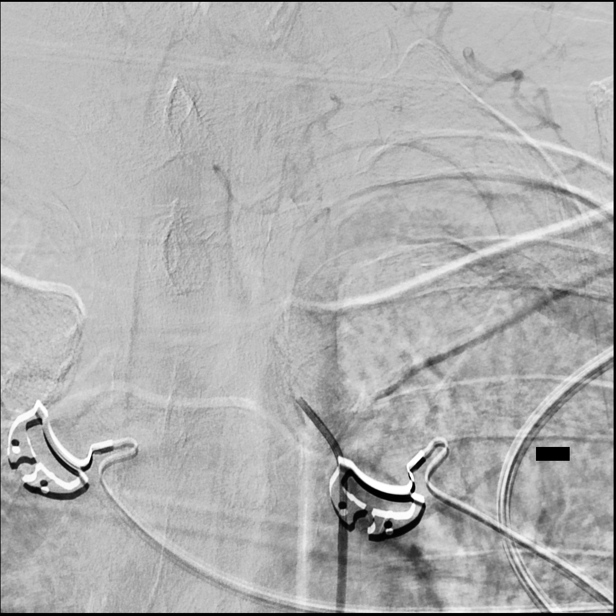
[im 164/222]
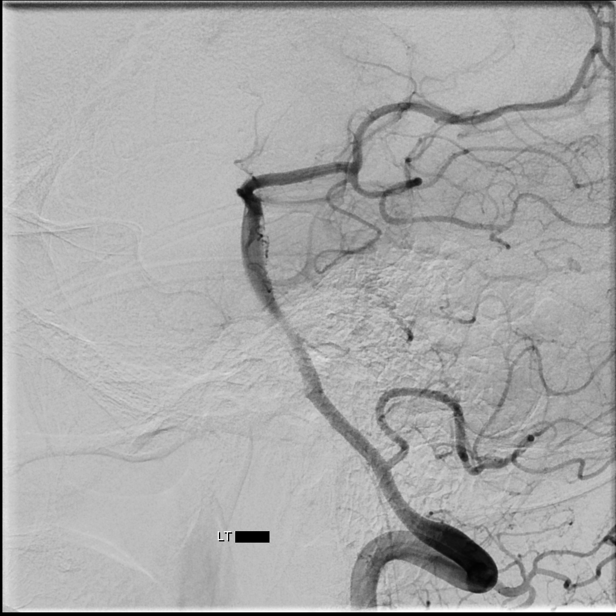
[im 183/222]
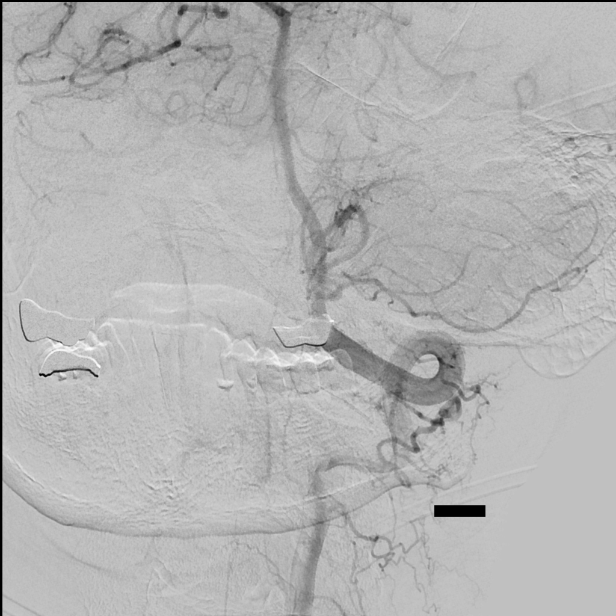
[im 202/222]
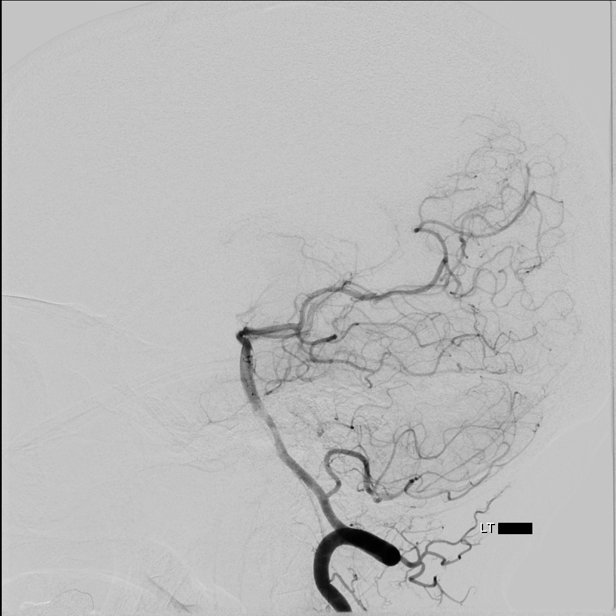
[im 222/222]
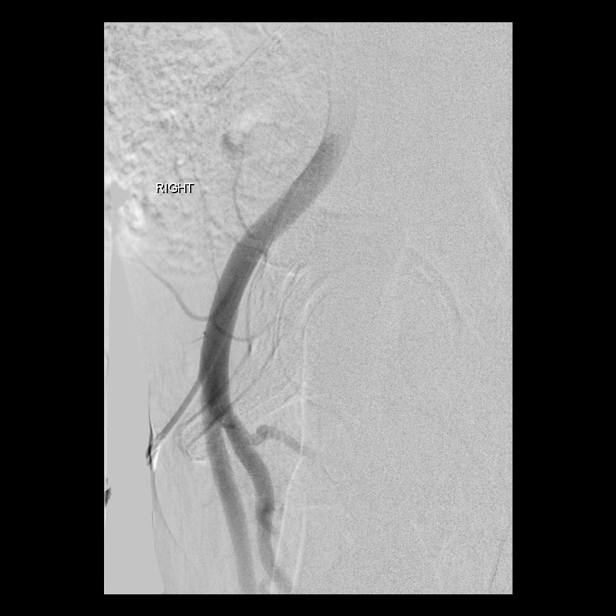

[13 of 24 positions shown; findings below may reference images not displayed]

Following a full explanation of the procedure along with the
potential associated complications, an informed witnessed consent
was obtained.

The right groin was prepped and draped in the usual sterile
fashion.  Thereafter using modified Seldinger technique,
transfemoral access into the right common femoral artery was
obtained without difficulty.  Over a 0.035-inch guidewire, a 5-
French Pinnacle sheath was inserted.  Through this and also over a
0.035-inch guidewire, a 5-French JB1 catheter was advanced to the
aortic arch region and selectively positioned in the right common
carotid artery, the right vertebral artery, the left common carotid
artery and the left vertebral artery.

There were no acute complications.  The patient tolerated the
procedure well.

Medications utilized: Versed 4 mg IV.  Dilaudid 2 mg IV.  Fentanyl
100 mcg IV.

Contrast: 5mnipaque-HOO approximately 60 ml.
FINDINGS: The right vertebral artery origin is normal.  The vessel is seen to
opacify normally to the cranial skull base.

There is normal opacification of the right posterior-inferior
cerebellar artery and the right vertebrobasilar junction.

The opacified portion of the basilar artery, the posterior cerebral
arteries, the superior cerebellar arteries and the anterior-
inferior cerebellar arteries is normal into the capillary and
venous phases.

Unopacified blood is seen in the basilar artery from the
contralateral vertebral artery.

The right common carotid arteriogram demonstrates the right
external carotid artery and its major branches to be normal.

The right internal carotid artery at the bulb to the cranial skull
base opacifies normally.

The petrous, the cavernous and the supraclinoid segments are
normal.

The right middle and the right anterior cerebral arteries opacify
normally into the capillary and venous phases.

The left common carotid arteriogram demonstrates the left external
carotid artery and its major branches to be normal.

The left internal carotid artery at the bulb to the cranial skull
base opacifies normally.

The petrous, the cavernous and the supraclinoid segments are
normal.

The left middle and the left anterior cerebral arteries opacify
normally into the capillary and venous phases.

The left vertebral artery origin is normal.  The vessel opacifies
normally to the cranial skull base.

There is normal opacification of the left posterior-inferior
cerebellar artery and the left vertebrobasilar junction.

The basilar artery, the posterior cerebral arteries, the superior
cerebellar arteries and the anterior-inferior cerebellar arteries
opacify normally into the capillary and venous phases.

Unopacified blood is seen in the basilar artery from the
contralateral vertebral artery.
IMPRESSION: 1.  Angiographically no evidence of an aneurysm, intracranial
dissection, stenosis, arteriovenous malformation or of dural AV
fistula.

2.  Venous return within normal limits.

## 2013-09-13 ENCOUNTER — Encounter: Payer: Self-pay | Admitting: Pulmonary Disease

## 2013-09-13 ENCOUNTER — Ambulatory Visit (INDEPENDENT_AMBULATORY_CARE_PROVIDER_SITE_OTHER): Payer: Medicare Other | Admitting: Pulmonary Disease

## 2013-09-13 VITALS — BP 124/78 | HR 64 | Temp 97.6°F

## 2013-09-13 DIAGNOSIS — J45909 Unspecified asthma, uncomplicated: Secondary | ICD-10-CM

## 2013-09-13 MED ORDER — CEFDINIR 300 MG PO CAPS: 600.0000 mg | ORAL_CAPSULE | Freq: Every day | ORAL | Status: DC

## 2013-09-13 NOTE — Patient Instructions (Signed)
No change in current maintenance meds. Will give you a prescription for omnicef to hold.  Please call to let us know if you have to use. Consider using neilmed sinus rinses am and pm when you first feel the infection coming on.  followup with me again in 36mos.

## 2013-09-13 NOTE — Assessment & Plan Note (Signed)
The patient had another episode of acute sinusitis in December, but responded well to her course of antibiotics. I've asked her to consider using sinus rinses at the first onset of symptoms to see if we can clear things up before it worsens.

## 2013-09-13 NOTE — Assessment & Plan Note (Signed)
The patient is doing very well from an asthma standpoint on her current medications. I've asked her to continue with this, and to stay as active as possible.

## 2013-09-13 NOTE — Progress Notes (Signed)
   Subjective:    Patient ID: Danielle Riddle, female    DOB: [birth year], 68 y.o.   MRN: [id number]  HPI Patient comes in today for followup of her known asthma. She had a recent episode of acute on chronic sinusitis, and started on antibiotic and did very well. She never had a flare of her asthma. She feels that she is breathing well, and rarely uses her rescue inhaler.   Review of Systems  Constitutional: Negative for fever and unexpected weight change.  HENT: Negative for congestion, dental problem, ear pain, nosebleeds, postnasal drip, rhinorrhea, sinus pressure, sneezing, sore throat and trouble swallowing.   Eyes: Negative for redness and itching.  Respiratory: Negative for cough, chest tightness, shortness of breath and wheezing.   Cardiovascular: Negative for palpitations and leg swelling.  Gastrointestinal: Negative for nausea and vomiting.  Genitourinary: Negative for dysuria.  Musculoskeletal: Negative for joint swelling.  Skin: Negative for rash.  Neurological: Negative for headaches.  Hematological: Does not bruise/bleed easily.  Psychiatric/Behavioral: Negative for dysphoric mood. The patient is not nervous/anxious.        Objective:   Physical Exam Thin female in no acute distress Nose without purulence or discharge noted Chest totally clear to auscultation, no wheezes Cardiac exam with regular rate and rhythm Alert and oriented, moves all 4 extremities.       Assessment & Plan:

## 2013-09-14 IMAGING — CT CT HEAD W/O CM
1 series · 16 of 30 positions shown, 20 images · non-contrast
Comparison: Multiple prior CT scans, most recent 12/18/2011.

CLINICAL DATA: Worsening neurologic status.  Recent subarachnoid
hemorrhage.

CT HEAD WITHOUT CONTRAST
TECHNIQUE: Contiguous axial images were obtained from the base of
the skull through the vertex without contrast.

[Series 2: head routine 4.8 h37s · axial · 0.43mm/px · z∈[-181,-29]mm · 16 of 36 slices shown, 20 images]
[im 2/36  brain]
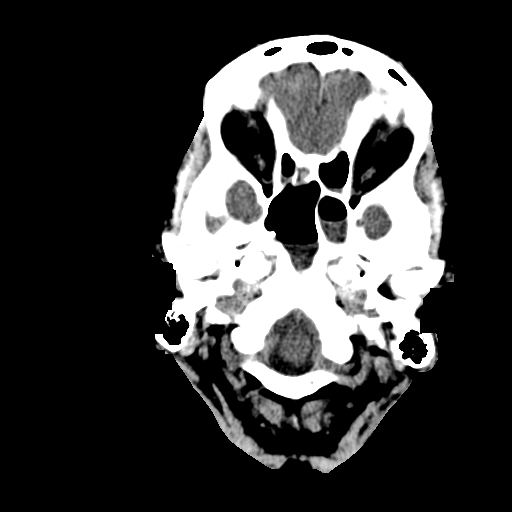
[im 2/36  bone]
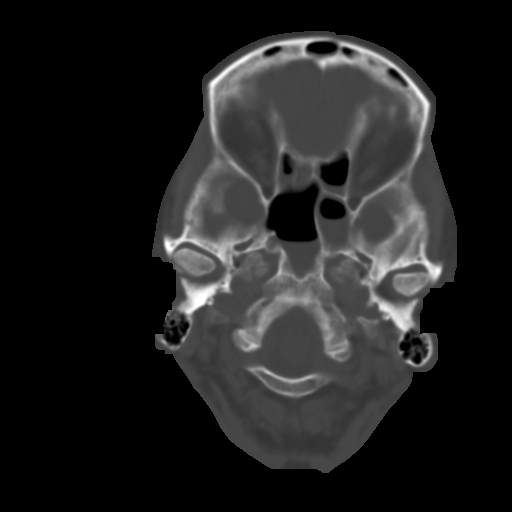
[im 4/36  brain]
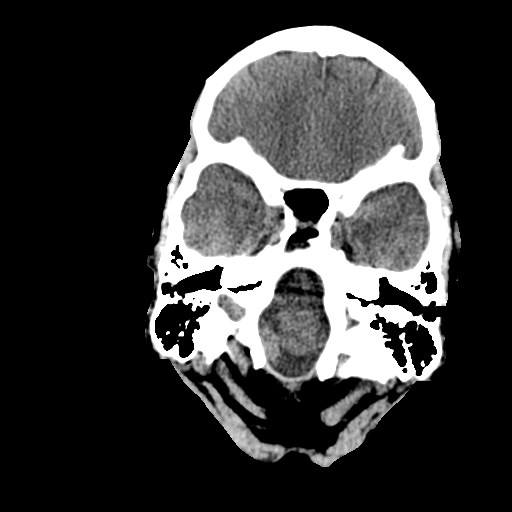
[im 7/36  brain]
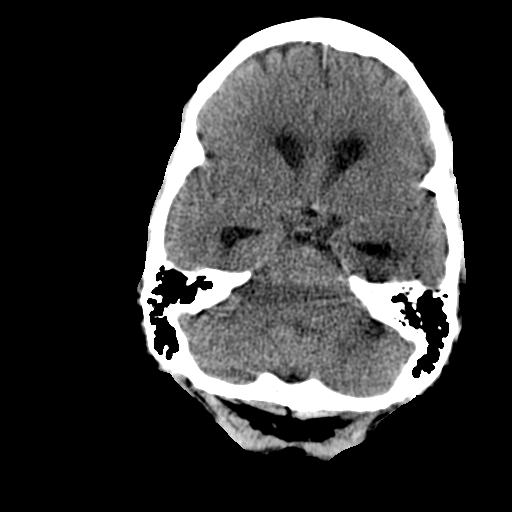
[im 9/36  brain]
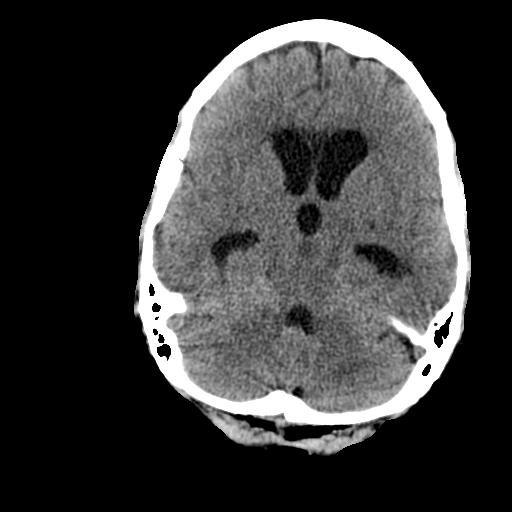
[im 10/36  brain]
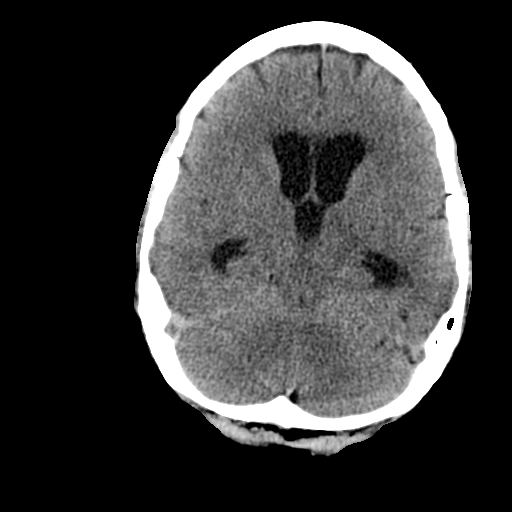
[im 10/36  bone]
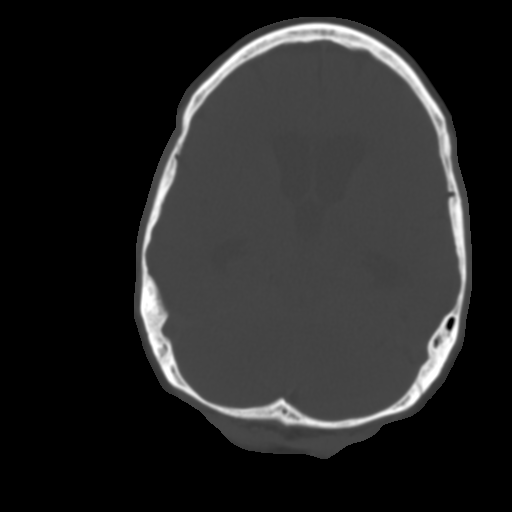
[im 13/36  brain]
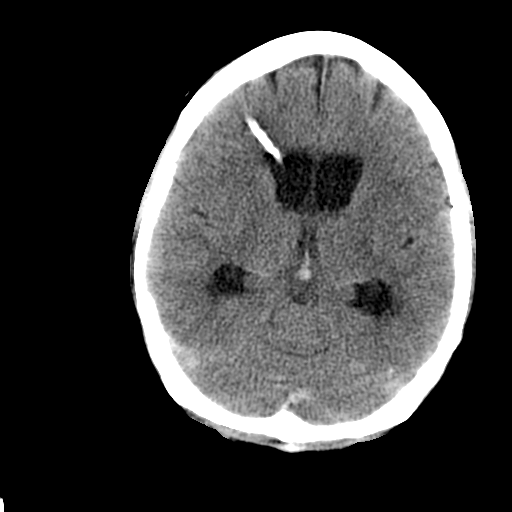
[im 15/36  brain]
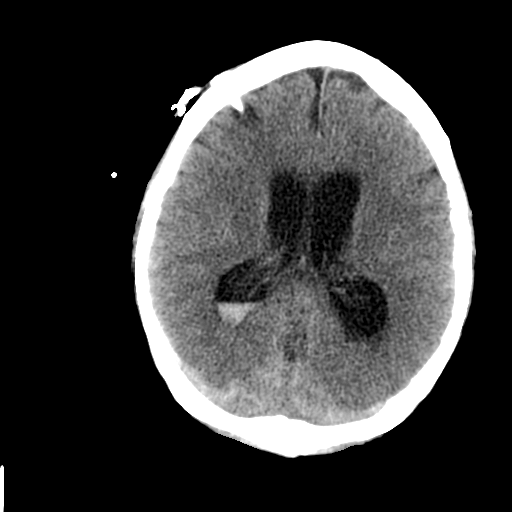
[im 17/36  brain]
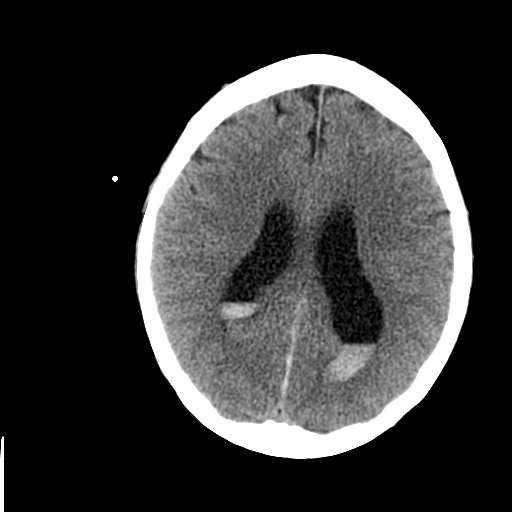
[im 19/36  brain]
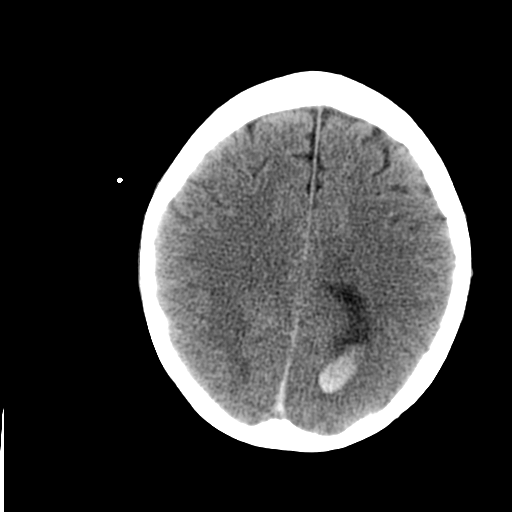
[im 19/36  bone]
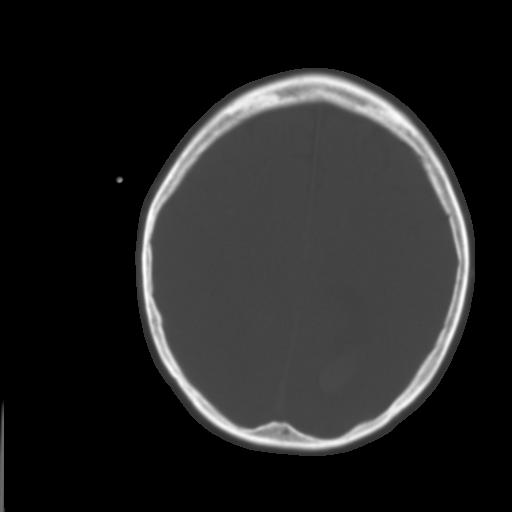
[im 21/36  brain]
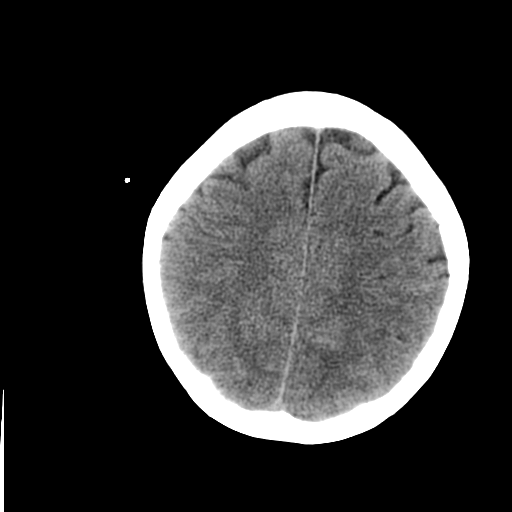
[im 23/36  brain]
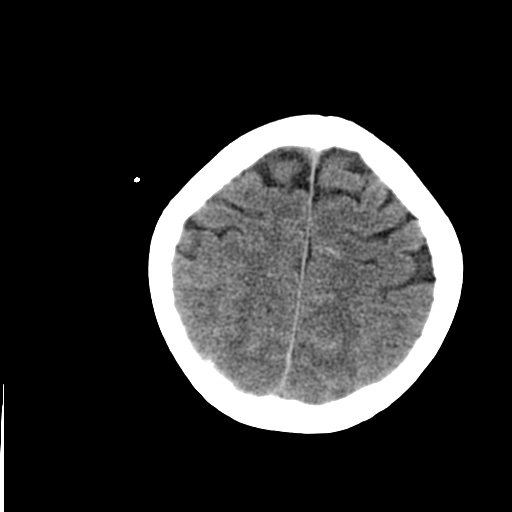
[im 26/36  brain]
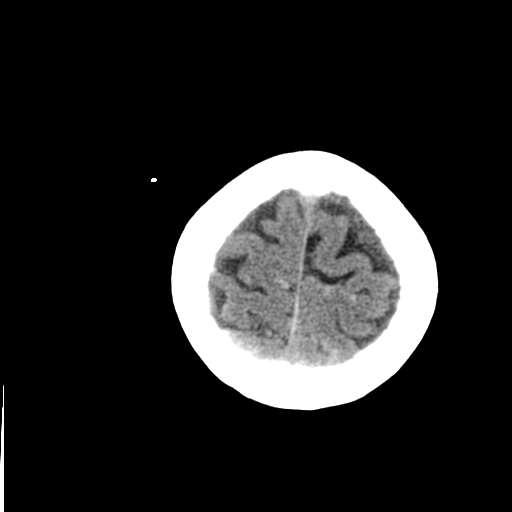
[im 27/36  brain]
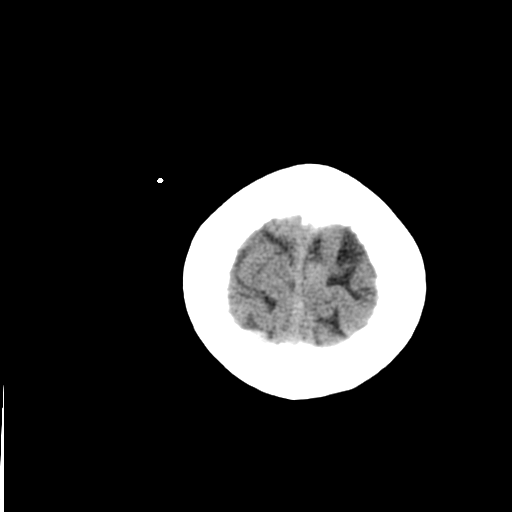
[im 27/36  bone]
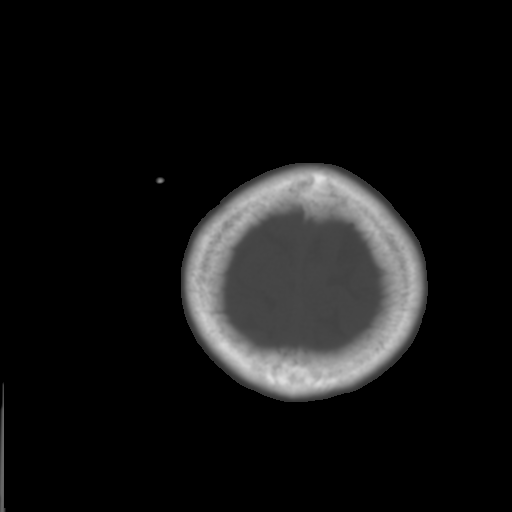
[im 29/36  brain]
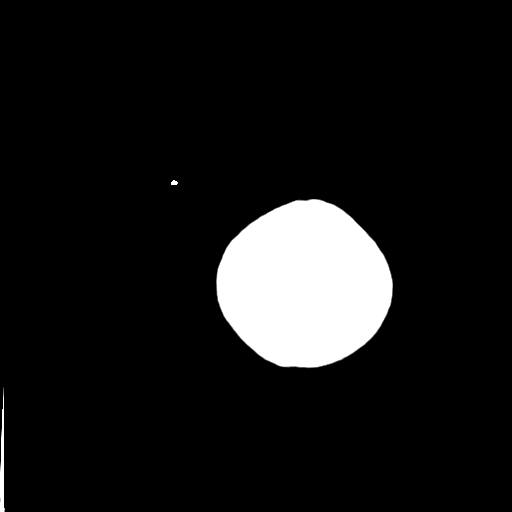
[im 32/36  brain]
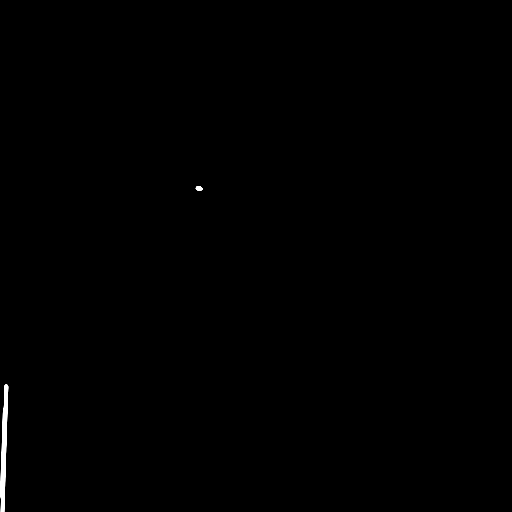
[im 34/36  brain]
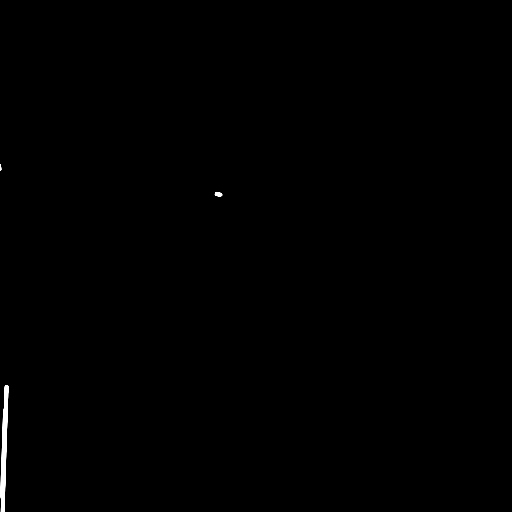

[16 of 30 positions shown; findings below may reference images not displayed]

FINDINGS: Despite the satisfactory location of the right frontal
ventricular drainage catheter, there is increasing hydrocephalus
when compared with the examination 2 days ago.  Bifrontal diameter
at the level of the foramen of Gaddafi is 41 mm as compared with 32
mm on 12/18/2011.  Temporal horns are dilated.  There is improving
subarachnoid blood, but persistent intraventricular blood.  No new
subarachnoid blood is seen. There is no visible infarction or
parenchymal hemorrhage.  No midline shift. No extra-axial fluid.
IMPRESSION: Worsening hydrocephalus despite good position of intraventricular
drainage catheter.  Dr. Elvyna aware.  No new subarachnoid blood.

## 2013-09-27 ENCOUNTER — Ambulatory Visit: Payer: Medicare Other | Admitting: Pulmonary Disease

## 2013-10-21 ENCOUNTER — Other Ambulatory Visit: Payer: Self-pay | Admitting: Pulmonary Disease

## 2013-11-10 ENCOUNTER — Other Ambulatory Visit: Payer: Self-pay | Admitting: Pulmonary Disease

## 2013-11-11 ENCOUNTER — Other Ambulatory Visit: Payer: Self-pay | Admitting: Pulmonary Disease

## 2014-03-05 ENCOUNTER — Other Ambulatory Visit: Payer: Self-pay | Admitting: Pulmonary Disease

## 2014-03-07 ENCOUNTER — Ambulatory Visit: Payer: Medicare Other | Admitting: Pulmonary Disease

## 2014-03-14 ENCOUNTER — Ambulatory Visit: Payer: Medicare Other | Admitting: Pulmonary Disease

## 2014-04-02 ENCOUNTER — Encounter: Payer: Self-pay | Admitting: Pulmonary Disease

## 2014-04-02 ENCOUNTER — Ambulatory Visit (INDEPENDENT_AMBULATORY_CARE_PROVIDER_SITE_OTHER): Payer: Medicare Other | Admitting: Pulmonary Disease

## 2014-04-02 VITALS — BP 100/60 | HR 63 | Temp 97.7°F | Ht 66.0 in | Wt 135.0 lb

## 2014-04-02 DIAGNOSIS — J4520 Mild intermittent asthma, uncomplicated: Secondary | ICD-10-CM

## 2014-04-02 DIAGNOSIS — IMO0001 Reserved for inherently not codable concepts without codable children: Secondary | ICD-10-CM

## 2014-04-02 DIAGNOSIS — J45909 Unspecified asthma, uncomplicated: Secondary | ICD-10-CM

## 2014-04-02 MED ORDER — MOMETASONE FURO-FORMOTEROL FUM 100-5 MCG/ACT IN AERO: INHALATION_SPRAY | RESPIRATORY_TRACT | Status: DC

## 2014-04-02 MED ORDER — OMEPRAZOLE 40 MG PO CPDR: DELAYED_RELEASE_CAPSULE | ORAL | Status: DC

## 2014-04-02 MED ORDER — FLUTICASONE PROPIONATE 50 MCG/ACT NA SUSP: NASAL | Status: DC

## 2014-04-02 MED ORDER — MONTELUKAST SODIUM 10 MG PO TABS: 10.0000 mg | ORAL_TABLET | ORAL | Status: DC

## 2014-04-02 NOTE — Patient Instructions (Signed)
No change in medications Will send in 90 day supply of your meds with refills. followup with me again in 67mos.

## 2014-04-02 NOTE — Assessment & Plan Note (Signed)
The patient is doing extremely well on her current asthma regimen, and has not had any acute exacerbation or increased rescue inhaler use since the last visit.  I have asked her to continue on current regimen, and will see back in about 6 months

## 2014-04-02 NOTE — Progress Notes (Signed)
   Subjective:    Patient ID: Danielle Riddle, female    DOB: [birth year], 68 y.o.   MRN: [id number]  HPI The patient comes in today for followup of her known asthma. She has done extremely well since the last visit, with no acute exacerbation or increased rescue inhaler use. She feels that she is breathing well, and is having no issues.   Review of Systems  Constitutional: Negative for fever and unexpected weight change.  HENT: Negative for congestion, dental problem, ear pain, nosebleeds, postnasal drip, rhinorrhea, sinus pressure, sneezing, sore throat and trouble swallowing.   Eyes: Negative for redness and itching.  Respiratory: Negative for cough, chest tightness, shortness of breath and wheezing.   Cardiovascular: Negative for palpitations and leg swelling.  Gastrointestinal: Negative for nausea and vomiting.  Genitourinary: Negative for dysuria.  Musculoskeletal: Negative for joint swelling.  Skin: Negative for rash.  Neurological: Negative for headaches.  Hematological: Does not bruise/bleed easily.  Psychiatric/Behavioral: Negative for dysphoric mood. The patient is not nervous/anxious.        Objective:   Physical Exam Thin female in no acute distress Nose without purulence or discharge noted Neck without lymphadenopathy or thyromegaly Chest totally clear to auscultation, no wheezing Cardiac exam with regular rate and rhythm Lower extremities without edema, no cyanosis Alert and oriented, moves all 4 extremities.       Assessment & Plan:

## 2014-04-02 NOTE — Addendum Note (Signed)
Addended by: Lilli Few on: 04/02/2014 11:21 AM   Modules accepted: Orders

## 2014-06-16 ENCOUNTER — Encounter: Payer: Self-pay | Admitting: Pulmonary Disease

## 2014-07-14 ENCOUNTER — Other Ambulatory Visit: Payer: Self-pay

## 2014-07-14 DIAGNOSIS — Z1231 Encounter for screening mammogram for malignant neoplasm of breast: Secondary | ICD-10-CM

## 2014-07-18 ENCOUNTER — Telehealth: Payer: Self-pay | Admitting: Pulmonary Disease

## 2014-07-18 NOTE — Telephone Encounter (Signed)
lmomtcb x1 for pt on mobile # and called home # [id number] x1

## 2014-07-18 NOTE — Telephone Encounter (Signed)
Let her know that I would usually treat this with a narcotic containing cough syrup to help sleep, but she is allergic to codeine.  Can try otc syrup, and we can try tessalon pearls 100mg   Take 2 up to every 6 hrs to see if helps.  #30 with one fill.

## 2014-07-18 NOTE — Telephone Encounter (Signed)
lmomtcb x1 

## 2014-07-18 NOTE — Telephone Encounter (Signed)
Pt called back and she stated that she is now on the abx that [facility] had given her.  She stated that she had been coughing up brown sputum but since being on the abx it is now light yellow and this is getting better.  She stated that her cough is worse at night and she is requesting a cough med from [facility].  Kimberly please advise. Thanks  Allergies  Allergen Reactions  . Codeine     Itching     Current Outpatient Prescriptions on File Prior to Visit  Medication Sig Dispense Refill  . fluticasone (FLONASE) 50 MCG/ACT nasal spray INHALE 2 SPRAYS INTO THE NOSE DAILY 48 g 4  . mometasone-formoterol (DULERA) 100-5 MCG/ACT AERO INHALE 2 PUFFS INTO THE LUNGS 2 (TWO) TIMES DAILY. 3 Inhaler 4  . montelukast (SINGULAIR) 10 MG tablet Take 1 tablet (10 mg total) by mouth every morning. 90 tablet 4  . omeprazole (PRILOSEC) 40 MG capsule TAKE ONE CAPSULE BY MOUTH DAILY 90 capsule 4   No current facility-administered medications on file prior to visit.

## 2014-07-22 NOTE — Telephone Encounter (Signed)
FYI for Dr clance - Pt states that over the weekend she was having cough with asthma like symptoms.  She started on Cefdinir and Pred taper that she had on hand and also used rescue inhaler and she is doing much better now.  Does not feel like she needs that Tessalon at this time.

## 2014-07-22 NOTE — Telephone Encounter (Signed)
Noted  

## 2014-07-29 ENCOUNTER — Telehealth: Payer: Self-pay | Admitting: Pulmonary Disease

## 2014-07-29 MED ORDER — BENZONATATE 100 MG PO CAPS: 100.0000 mg | ORAL_CAPSULE | Freq: Four times a day (QID) | ORAL | Status: DC | PRN

## 2014-07-29 MED ORDER — CEFDINIR 300 MG PO CAPS: 300.0000 mg | ORAL_CAPSULE | Freq: Two times a day (BID) | ORAL | Status: DC

## 2014-07-29 NOTE — Telephone Encounter (Signed)
Pt aware of recs.  Tessalon and omnicef sent in.  Nothing further needed.

## 2014-07-29 NOTE — Telephone Encounter (Signed)
Ok to call in Danielle Riddle 300mg , 2 qday for 7 days to hold.  Do not take unless she is getting worse or clearly not getting better. Since she cannot take codeine or derivatives, tessalon pearls may be only thing to try other than OTC for her cough.  Can take 100mg , 2 up to every 6 hrs if needed. #30, one fill.  If doesn't help, let us know.

## 2014-07-29 NOTE — Telephone Encounter (Signed)
Pt finished Cefdinir about 1 week ago.  Still c/o non prod cough causing diff sleeping and also worse in am's.  Asking for refill on abx due ti going out of town for Christmas and also something for cough.  Will try Tessalon if that's what Dr Gwenette Greet wants.  Please advise.

## 2014-08-12 ENCOUNTER — Ambulatory Visit
Admission: RE | Admit: 2014-08-12 | Discharge: 2014-08-12 | Disposition: A | Discharge status: Home / Self Care | Payer: Medicare Other | Source: Ambulatory Visit

## 2014-08-12 DIAGNOSIS — Z1231 Encounter for screening mammogram for malignant neoplasm of breast: Secondary | ICD-10-CM

## 2014-09-09 ENCOUNTER — Encounter: Payer: Self-pay | Admitting: Gynecology

## 2014-09-09 ENCOUNTER — Ambulatory Visit (INDEPENDENT_AMBULATORY_CARE_PROVIDER_SITE_OTHER): Payer: Medicare Other | Admitting: Gynecology

## 2014-09-09 VITALS — BP 120/76 | Ht 66.0 in

## 2014-09-09 DIAGNOSIS — Z01419 Encounter for gynecological examination (general) (routine) without abnormal findings: Secondary | ICD-10-CM | POA: Diagnosis not present

## 2014-09-09 DIAGNOSIS — N952 Postmenopausal atrophic vaginitis: Secondary | ICD-10-CM | POA: Diagnosis not present

## 2014-09-09 NOTE — Progress Notes (Signed)
Danielle Riddle 18-Dec-1945 [id number]        69 y.o.  G2P0202 for breast and pelvic exam. Several issues noted below.  Past medical history,surgical history, problem list, medications, allergies, family history and social history were all reviewed and documented as reviewed in the EPIC chart.  ROS:  Performed with pertinent positives and negatives included in the history, assessment and plan.   Additional significant findings :  none   Exam: [name] Counsellor Vitals:   09/09/14 1107  BP: 120/76  Height: 5\' 6"  (1.676 m)   General appearance:  Normal affect, orientation and appearance. Skin: Grossly normal HEENT: Without gross lesions.  No cervical or supraclavicular adenopathy. Thyroid normal.  Lungs:  Clear without wheezing, rales or rhonchi Cardiac: RR, without RMG Abdominal:  Soft, nontender, without masses, guarding, rebound, organomegaly or hernia Breasts:  Examined lying and sitting without masses, retractions, discharge or axillary adenopathy. Pelvic:  Ext/BUS/vagina with generalized atrophic changes  Cervix with atrophic changes  Uterus axial, normal size, shape and contour, midline and mobile nontender   Adnexa  Without masses or tenderness    Anus and perineum  Normal   Rectovaginal  Normal sphincter tone without palpated masses or tenderness.    Assessment/Plan:  69 y.o. G2P0202 female for annual exam.   1. Postmenopausal. Without significant hot flushes night sweats vaginal dryness or dyspareunia. No vaginal bleeding. Continue to monitor. Report any vaginal bleeding. 2. Pap smear/HPV negative 08/2013. No Pap smear done today. No history of significant abnormal Pap smears. Plan repeat Pap smear at 3-5 year interval per current screening guidelines. 3. Mammography 07/2014. Continue with annual mammography. SBE monthly reviewed. 4. DEXA 2011 normal. Plan repeat DEXA next year at five-year interval. Increased calcium vitamin D. 5. Colonoscopy scheduled  tomorrow. 6. Health maintenance. No routine blood work done as this is done at her primary physician's office. Follow up 1 year, sooner as needed.     Anastasio Auerbach MD, 11:25 AM 09/09/2014

## 2014-09-09 NOTE — Patient Instructions (Signed)
You may obtain a copy of any labs that were done today by logging onto MyChart as outlined in the instructions provided with your AVS (after visit summary). The office will not call with normal lab results but certainly if there are any significant abnormalities then we will contact you.   Health Maintenance, Female A healthy lifestyle and preventative care can promote health and wellness.  Maintain regular health, dental, and eye exams.  Eat a healthy diet. Foods like vegetables, fruits, whole grains, low-fat dairy products, and lean protein foods contain the nutrients you need without too many calories. Decrease your intake of foods high in solid fats, added sugars, and salt. Get information about a proper diet from your caregiver, if necessary.  Regular physical exercise is one of the most important things you can do for your health. Most adults should get at least 150 minutes of moderate-intensity exercise (any activity that increases your heart rate and causes you to sweat) each week. In addition, most adults need muscle-strengthening exercises on 2 or more days a week.   Maintain a healthy weight. The body mass index (BMI) is a screening tool to identify possible weight problems. It provides an estimate of body fat based on height and weight. Your caregiver can help determine your BMI, and can help you achieve or maintain a healthy weight. For adults 20 years and older:  A BMI below 18.5 is considered underweight.  A BMI of 18.5 to 24.9 is normal.  A BMI of 25 to 29.9 is considered overweight.  A BMI of 30 and above is considered obese.  Maintain normal blood lipids and cholesterol by exercising and minimizing your intake of saturated fat. Eat a balanced diet with plenty of fruits and vegetables. Blood tests for lipids and cholesterol should begin at age 61 and be repeated every 5 years. If your lipid or cholesterol levels are high, you are over 50, or you are a high risk for heart  disease, you may need your cholesterol levels checked more frequently.Ongoing high lipid and cholesterol levels should be treated with medicines if diet and exercise are not effective.  If you smoke, find out from your caregiver how to quit. If you do not use tobacco, do not start.  Lung cancer screening is recommended for adults aged 33 80 years who are at high risk for developing lung cancer because of a history of smoking. Yearly low-dose computed tomography (CT) is recommended for people who have at least a 30-pack-year history of smoking and are a current smoker or have quit within the past 15 years. A pack year of smoking is smoking an average of 1 pack of cigarettes a day for 1 year (for example: 1 pack a day for 30 years or 2 packs a day for 15 years). Yearly screening should continue until the smoker has stopped smoking for at least 15 years. Yearly screening should also be stopped for people who develop a health problem that would prevent them from having lung cancer treatment.  If you are pregnant, do not drink alcohol. If you are breastfeeding, be very cautious about drinking alcohol. If you are not pregnant and choose to drink alcohol, do not exceed 1 drink per day. One drink is considered to be 12 ounces (355 mL) of beer, 5 ounces (148 mL) of wine, or 1.5 ounces (44 mL) of liquor.  Avoid use of street drugs. Do not share needles with anyone. Ask for help if you need support or instructions about stopping  the use of drugs.  High blood pressure causes heart disease and increases the risk of stroke. Blood pressure should be checked at least every 1 to 2 years. Ongoing high blood pressure should be treated with medicines, if weight loss and exercise are not effective.  If you are 59 to 67 years old, ask your caregiver if you should take aspirin to prevent strokes.  Diabetes screening involves taking a blood sample to check your fasting blood sugar level. This should be done once every 3  years, after age 91, if you are within normal weight and without risk factors for diabetes. Testing should be considered at a younger age or be carried out more frequently if you are overweight and have at least 1 risk factor for diabetes.  Breast cancer screening is essential preventative care for women. You should practice "breast self-awareness." This means understanding the normal appearance and feel of your breasts and may include breast self-examination. Any changes detected, no matter how small, should be reported to a caregiver. Women in their 66s and 30s should have a clinical breast exam (CBE) by a caregiver as part of a regular health exam every 1 to 3 years. After age 101, women should have a CBE every year. Starting at age 100, women should consider having a mammogram (breast X-ray) every year. Women who have a family history of breast cancer should talk to their caregiver about genetic screening. Women at a high risk of breast cancer should talk to their caregiver about having an MRI and a mammogram every year.  Breast cancer gene (BRCA)-related cancer risk assessment is recommended for women who have family members with BRCA-related cancers. BRCA-related cancers include breast, ovarian, tubal, and peritoneal cancers. Having family members with these cancers may be associated with an increased risk for harmful changes (mutations) in the breast cancer genes BRCA1 and BRCA2. Results of the assessment will determine the need for genetic counseling and BRCA1 and BRCA2 testing.  The Pap test is a screening test for cervical cancer. Women should have a Pap test starting at age 57. Between ages 25 and 35, Pap tests should be repeated every 2 years. Beginning at age 37, you should have a Pap test every 3 years as long as the past 3 Pap tests have been normal. If you had a hysterectomy for a problem that was not cancer or a condition that could lead to cancer, then you no longer need Pap tests. If you are  between ages 50 and 76, and you have had normal Pap tests going back 10 years, you no longer need Pap tests. If you have had past treatment for cervical cancer or a condition that could lead to cancer, you need Pap tests and screening for cancer for at least 20 years after your treatment. If Pap tests have been discontinued, risk factors (such as a new sexual partner) need to be reassessed to determine if screening should be resumed. Some women have medical problems that increase the chance of getting cervical cancer. In these cases, your caregiver may recommend more frequent screening and Pap tests.  The human papillomavirus (HPV) test is an additional test that may be used for cervical cancer screening. The HPV test looks for the virus that can cause the cell changes on the cervix. The cells collected during the Pap test can be tested for HPV. The HPV test could be used to screen women aged 44 years and older, and should be used in women of any age  who have unclear Pap test results. After the age of 55, women should have HPV testing at the same frequency as a Pap test.  Colorectal cancer can be detected and often prevented. Most routine colorectal cancer screening begins at the age of 44 and continues through age 20. However, your caregiver may recommend screening at an earlier age if you have risk factors for colon cancer. On a yearly basis, your caregiver may provide home test kits to check for hidden blood in the stool. Use of a small camera at the end of a tube, to directly examine the colon (sigmoidoscopy or colonoscopy), can detect the earliest forms of colorectal cancer. Talk to your caregiver about this at age 86, when routine screening begins. Direct examination of the colon should be repeated every 5 to 10 years through age 13, unless early forms of pre-cancerous polyps or small growths are found.  Hepatitis C blood testing is recommended for all people born from [birth year] through 1965 and any  individual with known risks for hepatitis C.  Practice safe sex. Use condoms and avoid high-risk sexual practices to reduce the spread of sexually transmitted infections (STIs). Sexually active women aged 36 and younger should be checked for Chlamydia, which is a common sexually transmitted infection. Older women with new or multiple partners should also be tested for Chlamydia. Testing for other STIs is recommended if you are sexually active and at increased risk.  Osteoporosis is a disease in which the bones lose minerals and strength with aging. This can result in serious bone fractures. The risk of osteoporosis can be identified using a bone density scan. Women ages 20 and over and women at risk for fractures or osteoporosis should discuss screening with their caregivers. Ask your caregiver whether you should be taking a calcium supplement or vitamin D to reduce the rate of osteoporosis.  Menopause can be associated with physical symptoms and risks. Hormone replacement therapy is available to decrease symptoms and risks. You should talk to your caregiver about whether hormone replacement therapy is right for you.  Use sunscreen. Apply sunscreen liberally and repeatedly throughout the day. You should seek shade when your shadow is shorter than you. Protect yourself by wearing long sleeves, pants, a wide-brimmed hat, and sunglasses year round, whenever you are outdoors.  Notify your caregiver of new moles or changes in moles, especially if there is a change in shape or color. Also notify your caregiver if a mole is larger than the size of a pencil eraser.  Stay current with your immunizations. Document Released: 02/14/2011 Document Revised: 11/26/2012 Document Reviewed: 02/14/2011 [facility] Patient Information 2014 [employer].

## 2014-09-10 ENCOUNTER — Other Ambulatory Visit: Payer: Self-pay | Admitting: Gastroenterology

## 2014-09-10 LAB — URINALYSIS W MICROSCOPIC + REFLEX CULTURE
Bacteria, UA: NONE SEEN
Bilirubin Urine: NEGATIVE
CASTS: NONE SEEN
Crystals: NONE SEEN
GLUCOSE, UA: NEGATIVE mg/dL
Hgb urine dipstick: NEGATIVE
KETONES UR: NEGATIVE mg/dL
Leukocytes, UA: NEGATIVE
NITRITE: NEGATIVE
Protein, ur: NEGATIVE mg/dL
Specific Gravity, Urine: 1.005 (ref 1.005–1.030)
Squamous Epithelial / LPF: NONE SEEN
Urobilinogen, UA: 0.2 mg/dL (ref 0.0–1.0)
pH: 7.5 (ref 5.0–8.0)

## 2014-10-03 ENCOUNTER — Ambulatory Visit (INDEPENDENT_AMBULATORY_CARE_PROVIDER_SITE_OTHER): Payer: Medicare Other | Admitting: Pulmonary Disease

## 2014-10-03 ENCOUNTER — Encounter: Payer: Self-pay | Admitting: Pulmonary Disease

## 2014-10-03 VITALS — BP 122/64 | HR 69 | Temp 97.0°F | Ht 66.0 in

## 2014-10-03 DIAGNOSIS — J4520 Mild intermittent asthma, uncomplicated: Secondary | ICD-10-CM

## 2014-10-03 DIAGNOSIS — J309 Allergic rhinitis, unspecified: Secondary | ICD-10-CM

## 2014-10-03 DIAGNOSIS — J3089 Other allergic rhinitis: Secondary | ICD-10-CM

## 2014-10-03 DIAGNOSIS — J302 Other seasonal allergic rhinitis: Secondary | ICD-10-CM

## 2014-10-03 DIAGNOSIS — IMO0001 Reserved for inherently not codable concepts without codable children: Secondary | ICD-10-CM

## 2014-10-03 NOTE — Assessment & Plan Note (Signed)
She continues on her nasal hygiene regimen, but is unable to use the needle med rinses because of burning of her nasal passages. I have given her a recipe for making her own nasal rinses, and perhaps this will be better tolerated. I have asked her to continue on her Singulair and nasal spray as needed.

## 2014-10-03 NOTE — Progress Notes (Signed)
   Subjective:    Patient ID: Danielle Riddle, female    DOB: 11-26-1945, 69 y.o.   MRN: [id number]  HPI The patient comes in today for follow-up of her known asthma and allergic rhinitis/sinusitis. She has done well since last visit, but did have some issues in November of last year that required antibiotics. She enjoys an excellent quality of life and active lifestyle, and has not had to use her rescue inhaler since last November. She is having some nasal congestion and tinged mucus currently, but has not been using her saline rinses because of burning from the solution.   Review of Systems  Constitutional: Negative for fever and unexpected weight change.  HENT: Positive for congestion and postnasal drip. Negative for dental problem, ear pain, nosebleeds, rhinorrhea, sinus pressure, sneezing, sore throat and trouble swallowing.   Eyes: Negative for redness and itching.  Respiratory: Positive for cough. Negative for chest tightness, shortness of breath and wheezing.   Cardiovascular: Negative for palpitations and leg swelling.  Gastrointestinal: Negative for nausea and vomiting.  Genitourinary: Negative for dysuria.  Musculoskeletal: Negative for joint swelling.  Skin: Negative for rash.  Neurological: Negative for headaches.  Hematological: Does not bruise/bleed easily.  Psychiatric/Behavioral: Negative for dysphoric mood. The patient is not nervous/anxious.        Objective:   Physical Exam Thin female in no acute distress Nose without purulence or discharge noted Neck without lymphadenopathy or thyromegaly Chest totally clear to auscultation Cardiac exam with regular rate and rhythm Lower extremities without edema, no cyanosis Alert and oriented, moves all 4 extremities.       Assessment & Plan:

## 2014-10-03 NOTE — Assessment & Plan Note (Signed)
The patient seems to be doing well from an asthma standpoint, and rarely requires her rescue inhaler. She feels that her breathing is at a good level, and is remaining active.

## 2014-10-03 NOTE — Patient Instructions (Signed)
No change in medications Try using the "home recipe" for nasal saline irrigation Let me know if your sinus issues are worsening. followup with me again in 33mos

## 2015-02-03 ENCOUNTER — Telehealth: Payer: Self-pay | Admitting: Pulmonary Disease

## 2015-02-03 ENCOUNTER — Telehealth: Payer: Self-pay | Admitting: Critical Care Medicine

## 2015-02-03 NOTE — Telephone Encounter (Signed)
Crystal can you ask Dr Joya Gaskins about this please ?

## 2015-02-03 NOTE — Telephone Encounter (Signed)
Should not have opened under [place]/spm

## 2015-02-04 NOTE — Telephone Encounter (Signed)
Spoke with pt.  Explained below.  Pt understanding. Pt requesting to see Dr. Annamaria Boots.  First available is in Sept right now with Dr. Annamaria Boots.   Pt states visit is for f/u -- no problems at present and ok with waiting until Sept. Appt scheduled with Dr. Annamaria Boots for Sept 19 at 10am. Pt confirmed appt and will call back sooner if needed.

## 2015-02-04 NOTE — Telephone Encounter (Signed)
Per Patient, She would like to see Dr. Joya Gaskins, she has been advised that Dr. Joya Gaskins is no longer taking new patients, but she wanted [place] to ask Dr. Joya Gaskins if he would see her because she is the wife of Dr Chrissie Noa Lovena Neighbours  PW -please advise.

## 2015-02-04 NOTE — Telephone Encounter (Signed)
lmomtcb for pt 

## 2015-02-04 NOTE — Telephone Encounter (Signed)
Need to politely let her know I am actively closing my practice and would not be able to provide f/u care  Would recommend another provider  i am sorry

## 2015-02-09 ENCOUNTER — Other Ambulatory Visit: Payer: Self-pay

## 2015-03-09 ENCOUNTER — Ambulatory Visit: Payer: Medicare Other | Admitting: Pulmonary Disease

## 2015-04-08 ENCOUNTER — Ambulatory Visit: Payer: Medicare Other | Admitting: Pulmonary Disease

## 2015-05-04 ENCOUNTER — Ambulatory Visit (INDEPENDENT_AMBULATORY_CARE_PROVIDER_SITE_OTHER): Payer: Medicare Other | Admitting: Internal Medicine

## 2015-05-04 ENCOUNTER — Encounter: Payer: Self-pay | Admitting: Internal Medicine

## 2015-05-04 VITALS — BP 122/80 | HR 70 | Ht 66.0 in

## 2015-05-04 DIAGNOSIS — J4520 Mild intermittent asthma, uncomplicated: Secondary | ICD-10-CM

## 2015-05-04 DIAGNOSIS — J3089 Other allergic rhinitis: Secondary | ICD-10-CM

## 2015-05-04 DIAGNOSIS — Z23 Encounter for immunization: Secondary | ICD-10-CM | POA: Diagnosis not present

## 2015-05-04 DIAGNOSIS — J309 Allergic rhinitis, unspecified: Secondary | ICD-10-CM

## 2015-05-04 DIAGNOSIS — J302 Other seasonal allergic rhinitis: Secondary | ICD-10-CM

## 2015-05-04 DIAGNOSIS — IMO0001 Reserved for inherently not codable concepts without codable children: Secondary | ICD-10-CM

## 2015-05-04 MED ORDER — MONTELUKAST SODIUM 10 MG PO TABS: 10.0000 mg | ORAL_TABLET | ORAL | Status: DC

## 2015-05-04 MED ORDER — FLUTICASONE PROPIONATE 50 MCG/ACT NA SUSP: NASAL | Status: AC

## 2015-05-04 MED ORDER — OMEPRAZOLE 40 MG PO CPDR: DELAYED_RELEASE_CAPSULE | ORAL | Status: DC

## 2015-05-04 MED ORDER — MOMETASONE FURO-FORMOTEROL FUM 100-5 MCG/ACT IN AERO: INHALATION_SPRAY | RESPIRATORY_TRACT | Status: DC

## 2015-05-04 MED ORDER — ALBUTEROL SULFATE HFA 108 (90 BASE) MCG/ACT IN AERS: 2.0000 | INHALATION_SPRAY | Freq: Four times a day (QID) | RESPIRATORY_TRACT | Status: AC | PRN

## 2015-05-04 NOTE — Patient Instructions (Signed)
Refills sent for meds   Flu vax

## 2015-05-04 NOTE — Progress Notes (Signed)
   Subjective:    Patient ID: Danielle Riddle, female    DOB: 04-21-1946, 69 y.o.   MRN: [id number]  HPI   10/03/14 The patient comes in today for follow-up of her known asthma and allergic rhinitis/sinusitis. She has done well since last visit, but did have some issues in November of last year that required antibiotics. She enjoys an excellent quality of life and active lifestyle, and has not had to use her rescue inhaler since last November. She is having some nasal congestion and tinged mucus currently, but has not been using her saline rinses because of burning from the solution.  05/04/15-  24 yoF followed for asthma, allergic rhinitis, complicated by history of subarachnoid hemorrhage, chronic sinusitis, head trauma Follows For: former [place] pt. pt states she is doing well. no concerns at this time.  Describes feeling very well with no rhinitis wheezing or chest tightness in this fall season. No recent antibiotics.  Review of Systems  Constitutional: Negative for fever and unexpected weight change.  HENT: Positive for and. Negative for dental problem, ear pain, nosebleeds, rhinorrhea, sinus pressure, sneezing, sore throat and trouble swallowing.   Eyes: Negative for redness and itching.  Respiratory: Positive for . Negative for chest tightness, shortness of breath and wheezing.   Cardiovascular: Negative for palpitations and leg swelling.  Gastrointestinal: Negative for nausea and vomiting.  Genitourinary: Negative for dysuria.  Musculoskeletal: Negative for joint swelling.  Skin: Negative for rash.  Neurological: Negative for headaches.  Hematological: Does not bruise/bleed easily.  Psychiatric/Behavioral: Negative for dysphoric mood. The patient is not nervous/anxious.      Objective:   Physical Exam Thin female in no acute distress Nose without purulence or discharge noted Neck without lymphadenopathy or thyromegaly Chest totally clear to auscultation Cardiac exam with regular rate  and rhythm Lower extremities without edema, no cyanosis Alert and oriented, moves all 4 extremities.       Assessment & Plan:

## 2015-05-05 NOTE — Assessment & Plan Note (Signed)
She is compliant with medications and feels she is doing extremely well with no changes needed.

## 2015-05-05 NOTE — Assessment & Plan Note (Signed)
Mild intermittent well-controlled. Medications discussed. Plan-flu vaccine

## 2015-06-22 ENCOUNTER — Other Ambulatory Visit: Payer: Self-pay | Admitting: *Deleted

## 2015-06-29 ENCOUNTER — Other Ambulatory Visit: Payer: Self-pay | Admitting: Neurosurgery

## 2015-06-29 DIAGNOSIS — D320 Benign neoplasm of cerebral meninges: Secondary | ICD-10-CM

## 2015-08-20 ENCOUNTER — Ambulatory Visit
Admission: RE | Admit: 2015-08-20 | Discharge: 2015-08-20 | Disposition: A | Discharge status: Home / Self Care | Payer: Medicare Other | Source: Ambulatory Visit | Attending: Neurosurgery | Admitting: Neurosurgery

## 2015-08-20 DIAGNOSIS — D320 Benign neoplasm of cerebral meninges: Secondary | ICD-10-CM

## 2015-08-20 MED ORDER — GADOBENATE DIMEGLUMINE 529 MG/ML IV SOLN
13.0000 mL | Freq: Once | INTRAVENOUS | Status: AC | PRN
  Administered 2015-08-20: 13 mL via INTRAVENOUS

## 2015-09-22 ENCOUNTER — Other Ambulatory Visit: Payer: Self-pay

## 2015-09-22 DIAGNOSIS — Z1231 Encounter for screening mammogram for malignant neoplasm of breast: Secondary | ICD-10-CM

## 2015-10-02 ENCOUNTER — Ambulatory Visit
Admission: RE | Admit: 2015-10-02 | Discharge: 2015-10-02 | Disposition: A | Discharge status: Home / Self Care | Payer: Medicare Other | Source: Ambulatory Visit

## 2015-10-02 DIAGNOSIS — Z1231 Encounter for screening mammogram for malignant neoplasm of breast: Secondary | ICD-10-CM

## 2015-11-02 ENCOUNTER — Ambulatory Visit (INDEPENDENT_AMBULATORY_CARE_PROVIDER_SITE_OTHER): Payer: Medicare Other | Admitting: Internal Medicine

## 2015-11-02 ENCOUNTER — Encounter: Payer: Self-pay | Admitting: Internal Medicine

## 2015-11-02 VITALS — BP 122/78 | HR 80 | Ht 66.0 in | Wt 140.0 lb

## 2015-11-02 DIAGNOSIS — J302 Other seasonal allergic rhinitis: Secondary | ICD-10-CM

## 2015-11-02 DIAGNOSIS — G4700 Insomnia, unspecified: Secondary | ICD-10-CM | POA: Diagnosis not present

## 2015-11-02 DIAGNOSIS — J309 Allergic rhinitis, unspecified: Secondary | ICD-10-CM | POA: Diagnosis not present

## 2015-11-02 DIAGNOSIS — J3089 Other allergic rhinitis: Secondary | ICD-10-CM

## 2015-11-02 DIAGNOSIS — J4520 Mild intermittent asthma, uncomplicated: Secondary | ICD-10-CM

## 2015-11-02 NOTE — Patient Instructions (Signed)
Samples Belsomra 15 mg to try at bedtime for sleep.   It may take a few nights to judge.  Order- Office Spirometry    Dx asthma mild intermittent
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
## 2015-11-02 NOTE — Assessment & Plan Note (Signed)
No exacerbations. We discussed use of OTC antihistamines and Flonase if needed

## 2015-11-02 NOTE — Assessment & Plan Note (Signed)
"  Busy brain" pattern. She has been trying to use Benadryl. We discussed good sleep habits. Plan-try samples Belsomra 20 mg.

## 2015-11-02 NOTE — Assessment & Plan Note (Addendum)
Very good year with no acute events. She really isn't needing her maintenance inhaler anymore and rarely needs rescue. Spirometry is normal. Plan-okay to drop Dulera and watch need for rescue inhaler

## 2016-05-05 ENCOUNTER — Encounter: Payer: Self-pay | Admitting: Internal Medicine

## 2016-05-05 ENCOUNTER — Ambulatory Visit (INDEPENDENT_AMBULATORY_CARE_PROVIDER_SITE_OTHER): Payer: Medicare Other | Admitting: Internal Medicine

## 2016-05-05 ENCOUNTER — Ambulatory Visit: Payer: Medicare Other | Admitting: Internal Medicine

## 2016-05-05 DIAGNOSIS — J4520 Mild intermittent asthma, uncomplicated: Secondary | ICD-10-CM

## 2016-05-05 DIAGNOSIS — Z23 Encounter for immunization: Secondary | ICD-10-CM

## 2016-05-05 DIAGNOSIS — J309 Allergic rhinitis, unspecified: Secondary | ICD-10-CM | POA: Diagnosis not present

## 2016-05-05 DIAGNOSIS — J3089 Other allergic rhinitis: Principal | ICD-10-CM

## 2016-05-05 DIAGNOSIS — J302 Other seasonal allergic rhinitis: Secondary | ICD-10-CM

## 2016-05-05 NOTE — Patient Instructions (Addendum)
Flu vax  Ok to stay off the inhalers and Singulair and see how you do without. Since the [facility] was associated with dizziness, we won't refill that now, but there are alternatives to try if ever needed in the future.   Please call if you need our help
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
## 2016-05-05 NOTE — Assessment & Plan Note (Addendum)
No recent exacerbation or need for routine or rescue medications. I doubt Singulair is doing anything for her now and she can stop using it to see what happens. Okay to call back as needed. Flu vaccine today

## 2016-05-05 NOTE — Assessment & Plan Note (Signed)
Not noticing significant nasal congestion or drainage. Continues OTC antihistamine if needed.

## 2016-06-19 ENCOUNTER — Other Ambulatory Visit: Payer: Self-pay | Admitting: Internal Medicine

## 2016-09-14 ENCOUNTER — Other Ambulatory Visit: Payer: Self-pay | Admitting: Internal Medicine

## 2016-10-10 ENCOUNTER — Other Ambulatory Visit: Payer: Self-pay | Admitting: Family Medicine

## 2016-10-10 DIAGNOSIS — Z1231 Encounter for screening mammogram for malignant neoplasm of breast: Secondary | ICD-10-CM

## 2016-10-26 ENCOUNTER — Ambulatory Visit
Admission: RE | Admit: 2016-10-26 | Discharge: 2016-10-26 | Disposition: A | Discharge status: Home / Self Care | Payer: Medicare Other | Source: Ambulatory Visit | Attending: Family Medicine | Admitting: Family Medicine

## 2016-10-26 DIAGNOSIS — Z1231 Encounter for screening mammogram for malignant neoplasm of breast: Secondary | ICD-10-CM

## 2016-11-21 ENCOUNTER — Other Ambulatory Visit: Payer: Self-pay | Admitting: Internal Medicine

## 2017-02-13 ENCOUNTER — Other Ambulatory Visit: Payer: Self-pay | Admitting: Internal Medicine

## 2017-02-17 ENCOUNTER — Other Ambulatory Visit: Payer: Self-pay | Admitting: Internal Medicine

## 2017-05-01 ENCOUNTER — Encounter: Payer: Medicare Other | Admitting: Gynecology

## 2017-05-09 ENCOUNTER — Other Ambulatory Visit: Payer: Self-pay | Admitting: Internal Medicine

## 2017-05-11 ENCOUNTER — Other Ambulatory Visit: Payer: Self-pay | Admitting: Internal Medicine

## 2017-08-15 ENCOUNTER — Other Ambulatory Visit: Payer: Self-pay | Admitting: Internal Medicine

## 2017-09-03 ENCOUNTER — Other Ambulatory Visit: Payer: Self-pay | Admitting: Neurosurgery

## 2017-09-03 DIAGNOSIS — D329 Benign neoplasm of meninges, unspecified: Secondary | ICD-10-CM

## 2017-09-15 ENCOUNTER — Ambulatory Visit
Admission: RE | Admit: 2017-09-15 | Discharge: 2017-09-15 | Disposition: A | Discharge status: Home / Self Care | Payer: Medicare Other | Source: Ambulatory Visit | Attending: Neurosurgery | Admitting: Neurosurgery

## 2017-09-15 DIAGNOSIS — D329 Benign neoplasm of meninges, unspecified: Secondary | ICD-10-CM

## 2017-09-15 MED ORDER — GADOBENATE DIMEGLUMINE 529 MG/ML IV SOLN
13.0000 mL | Freq: Once | INTRAVENOUS | Status: AC | PRN
  Administered 2017-09-15: 13 mL via INTRAVENOUS

## 2017-10-15 ENCOUNTER — Emergency Department (HOSPITAL_COMMUNITY): Payer: Medicare Other

## 2017-10-15 ENCOUNTER — Other Ambulatory Visit: Payer: Self-pay

## 2017-10-15 ENCOUNTER — Emergency Department (HOSPITAL_COMMUNITY)
Admission: EM | Admit: 2017-10-15 | Discharge: 2017-10-15 | Disposition: A | Discharge status: Home / Self Care | Payer: Medicare Other | Attending: Emergency Medicine | Admitting: Emergency Medicine

## 2017-10-15 ENCOUNTER — Encounter (HOSPITAL_COMMUNITY): Payer: Self-pay | Admitting: *Deleted

## 2017-10-15 DIAGNOSIS — Y9289 Other specified places as the place of occurrence of the external cause: Secondary | ICD-10-CM | POA: Diagnosis not present

## 2017-10-15 DIAGNOSIS — S99911A Unspecified injury of right ankle, initial encounter: Secondary | ICD-10-CM | POA: Diagnosis present

## 2017-10-15 DIAGNOSIS — Z79899 Other long term (current) drug therapy: Secondary | ICD-10-CM | POA: Insufficient documentation

## 2017-10-15 DIAGNOSIS — W010XXA Fall on same level from slipping, tripping and stumbling without subsequent striking against object, initial encounter: Secondary | ICD-10-CM | POA: Diagnosis not present

## 2017-10-15 DIAGNOSIS — Y93K1 Activity, walking an animal: Secondary | ICD-10-CM | POA: Diagnosis not present

## 2017-10-15 DIAGNOSIS — J45909 Unspecified asthma, uncomplicated: Secondary | ICD-10-CM | POA: Diagnosis not present

## 2017-10-15 DIAGNOSIS — Y999 Unspecified external cause status: Secondary | ICD-10-CM | POA: Diagnosis not present

## 2017-10-15 DIAGNOSIS — S82841A Displaced bimalleolar fracture of right lower leg, initial encounter for closed fracture: Secondary | ICD-10-CM | POA: Diagnosis not present

## 2017-10-15 DIAGNOSIS — F17210 Nicotine dependence, cigarettes, uncomplicated: Secondary | ICD-10-CM | POA: Diagnosis not present

## 2017-10-15 MED ORDER — MORPHINE SULFATE (PF) 2 MG/ML IV SOLN
2.0000 mg | Freq: Once | INTRAVENOUS | Status: AC
Start: 2017-10-15 — End: 2017-10-15
  Administered 2017-10-15: 2 mg via INTRAMUSCULAR
  Filled 2017-10-15: qty 1

## 2017-10-15 MED ORDER — METHOCARBAMOL 500 MG PO TABS: 500.0000 mg | ORAL_TABLET | Freq: Three times a day (TID) | ORAL | 0 refills | Status: AC | PRN

## 2017-10-15 MED ORDER — ONDANSETRON 4 MG PO TBDP: 4.0000 mg | ORAL_TABLET | Freq: Three times a day (TID) | ORAL | 0 refills | Status: AC | PRN

## 2017-10-15 MED ORDER — FENTANYL CITRATE (PF) 100 MCG/2ML IJ SOLN
50.0000 ug | Freq: Once | INTRAMUSCULAR | Status: AC
  Administered 2017-10-15: 50 ug via INTRAVENOUS
  Filled 2017-10-15: qty 2

## 2017-10-15 MED ORDER — OXYCODONE-ACETAMINOPHEN 5-325 MG PO TABS: 1.0000 | ORAL_TABLET | ORAL | 0 refills | Status: AC | PRN

## 2017-10-15 MED ORDER — SODIUM CHLORIDE 0.9 % IV BOLUS (SEPSIS)
1000.0000 mL | Freq: Once | INTRAVENOUS | Status: AC
  Administered 2017-10-15: 1000 mL via INTRAVENOUS

## 2017-10-15 MED ORDER — ONDANSETRON HCL 4 MG/2ML IJ SOLN
4.0000 mg | Freq: Once | INTRAMUSCULAR | Status: DC
  Filled 2017-10-15: qty 2

## 2017-10-15 MED ORDER — PROPOFOL 10 MG/ML IV BOLUS
INTRAVENOUS | Status: AC | PRN
  Administered 2017-10-15: 30 mg via INTRAVENOUS
  Administered 2017-10-15: 70 mg via INTRAVENOUS

## 2017-10-15 MED ORDER — PROPOFOL 10 MG/ML IV BOLUS
1.0000 mg/kg | Freq: Once | INTRAVENOUS | Status: AC
  Administered 2017-10-15: 20:00:00 via INTRAVENOUS
  Filled 2017-10-15: qty 20

## 2017-10-15 NOTE — ED Triage Notes (Signed)
Pt slipped and fell down a hill, twisting her right ankle. Pt's ankle is externally rotated.

## 2017-10-15 NOTE — ED Notes (Signed)
Pt has rt ankle swelling, Pt is alert and oriented x4 and is verbally responsive. Pt reports that she slipped down a small hill while walking the dog. Pt reports that the ground was moist.
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
## 2017-10-15 NOTE — Discharge Instructions (Addendum)
Elevate your leg above the level of your heart as often as possible. You can use ice for the next 24 hours, but take care not to leave ice directly on your splint for too long.  Do not get the splint wet or soiled.  Take the medications as prescribed. As we discussed, I would recommend starting Colace stool softener to prevent constipation.  Please call with any questions or concerns. It was a pleasure meeting you.  Duffy Bruce

## 2017-10-15 NOTE — ED Notes (Signed)
Pt is alert and orinted x 4 and verbally responsive.

## 2017-10-16 NOTE — ED Provider Notes (Signed)
.  Sedation Date/Time: 10/16/2017 1:53 AM Performed by: Duffy Bruce, MD Authorized by: Duffy Bruce, MD   Consent:    Consent obtained:  Verbal   Consent given by:  Patient   Risks discussed:  Allergic reaction, dysrhythmia, inadequate sedation, nausea, prolonged hypoxia resulting in organ damage, prolonged sedation necessitating reversal, respiratory compromise necessitating ventilatory assistance and intubation and vomiting   Alternatives discussed:  Analgesia without sedation, anxiolysis and regional anesthesia Universal protocol:    Procedure explained and questions answered to patient or proxy's satisfaction: yes     Relevant documents present and verified: yes     Test results available and properly labeled: yes     Imaging studies available: yes     Required blood products, implants, devices, and special equipment available: yes     Site/side marked: yes     Immediately prior to procedure a time out was called: yes     Patient identity confirmation method:  Verbally with patient Indications:    Procedure performed:  Fracture reduction   Procedure necessitating sedation performed by:  Different physician   Intended level of sedation:  Moderate (conscious sedation) Pre-sedation assessment:    Time since last food or drink:  4   NPO status caution: urgency dictates proceeding with non-ideal NPO status     ASA classification: class 1 - normal, healthy patient     Neck mobility: normal     Mouth opening:  3 or more finger widths   Thyromental distance:  4 finger widths   Mallampati score:  I - soft palate, uvula, fauces, pillars visible   Pre-sedation assessments completed and reviewed: airway patency, cardiovascular function, hydration status, mental status, nausea/vomiting, pain level, respiratory function and temperature   Immediate pre-procedure details:    Reassessment: Patient reassessed immediately prior to procedure     Reviewed: vital signs, relevant labs/tests and NPO  status     Verified: bag valve mask available, emergency equipment available, intubation equipment available, IV patency confirmed, oxygen available and suction available   Procedure details (see MAR for exact dosages):    Preoxygenation:  Nasal cannula   Sedation:  Propofol   Intra-procedure monitoring:  Blood pressure monitoring, cardiac monitor, continuous pulse oximetry, frequent LOC assessments, frequent vital sign checks and continuous capnometry   Intra-procedure events: none     Total Provider sedation time (minutes):  20 Post-procedure details:    Attendance: Constant attendance by certified staff until patient recovered     Recovery: Patient returned to pre-procedure baseline     Post-sedation assessments completed and reviewed: airway patency, cardiovascular function, hydration status, mental status, nausea/vomiting, pain level, respiratory function and temperature     Patient is stable for discharge or admission: yes     Patient tolerance:  Tolerated well, no immediate complications      Duffy Bruce, MD 10/16/17 [phone]

## 2017-11-29 ENCOUNTER — Other Ambulatory Visit: Payer: Self-pay | Admitting: Family Medicine

## 2017-11-29 DIAGNOSIS — Z1231 Encounter for screening mammogram for malignant neoplasm of breast: Secondary | ICD-10-CM

## 2017-12-20 ENCOUNTER — Ambulatory Visit
Admission: RE | Admit: 2017-12-20 | Discharge: 2017-12-20 | Disposition: A | Discharge status: Home / Self Care | Payer: Medicare Other | Source: Ambulatory Visit | Attending: Family Medicine | Admitting: Family Medicine

## 2017-12-20 DIAGNOSIS — Z1231 Encounter for screening mammogram for malignant neoplasm of breast: Secondary | ICD-10-CM

## 2019-05-22 ENCOUNTER — Encounter: Payer: Self-pay | Admitting: Gynecology
